# Patient Record
Sex: Female | Born: 1990
Health system: Southern US, Community
[De-identification: ages and names within clinical notes are randomized; demographics above are authoritative.]

## PROBLEM LIST (undated history)

## (undated) DIAGNOSIS — Z1330 Encounter for screening examination for mental health and behavioral disorders, unspecified: Secondary | ICD-10-CM

## (undated) DIAGNOSIS — F909 Attention-deficit hyperactivity disorder, unspecified type: Secondary | ICD-10-CM

## (undated) DIAGNOSIS — H9190 Unspecified hearing loss, unspecified ear: Secondary | ICD-10-CM

## (undated) DIAGNOSIS — T7840XA Allergy, unspecified, initial encounter: Secondary | ICD-10-CM

## (undated) DIAGNOSIS — Q899 Congenital malformation, unspecified: Secondary | ICD-10-CM

## (undated) DIAGNOSIS — E2839 Other primary ovarian failure: Secondary | ICD-10-CM

## (undated) DIAGNOSIS — R6250 Unspecified lack of expected normal physiological development in childhood: Secondary | ICD-10-CM

## (undated) DIAGNOSIS — F988 Other specified behavioral and emotional disorders with onset usually occurring in childhood and adolescence: Secondary | ICD-10-CM

## (undated) DIAGNOSIS — K5900 Constipation, unspecified: Secondary | ICD-10-CM

## (undated) DIAGNOSIS — Z1342 Encounter for screening for global developmental delays (milestones): Secondary | ICD-10-CM

## (undated) HISTORY — DX: Constipation, unspecified: K59.00

## (undated) HISTORY — DX: Unspecified lack of expected normal physiological development in childhood: R62.50

## (undated) HISTORY — DX: Unspecified hearing loss, unspecified ear: H91.90

## (undated) HISTORY — DX: Allergy, unspecified, initial encounter: T78.40XA

## (undated) HISTORY — PX: BACK SURGERY: SHX140

## (undated) HISTORY — DX: Other primary ovarian failure: E28.39

## (undated) HISTORY — DX: Encounter for screening for global developmental delays (milestones): Z13.42

## (undated) HISTORY — DX: Other specified behavioral and emotional disorders with onset usually occurring in childhood and adolescence: F98.8

## (undated) HISTORY — PX: SPINE SURGERY: SHX786

## (undated) HISTORY — DX: Encounter for screening examination for mental health and behavioral disorders, unspecified: Z13.30

## (undated) HISTORY — DX: Congenital malformation, unspecified: Q89.9

---
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
## 2009-01-20 ENCOUNTER — Ambulatory Visit (HOSPITAL_COMMUNITY): Admission: RE | Admit: 2009-01-20 | Discharge: 2009-01-20 | Payer: Self-pay | Admitting: Obstetrics & Gynecology

## 2010-04-15 ENCOUNTER — Ambulatory Visit (HOSPITAL_COMMUNITY)
Admission: AD | Admit: 2010-04-15 | Discharge: 2010-04-15 | Disposition: A | Discharge status: Home / Self Care | Payer: Medicaid Other | Source: Ambulatory Visit | Attending: Obstetrics & Gynecology | Admitting: Obstetrics & Gynecology

## 2010-04-15 ENCOUNTER — Other Ambulatory Visit: Payer: Self-pay | Admitting: Obstetrics & Gynecology

## 2010-04-15 DIAGNOSIS — F09 Unspecified mental disorder due to known physiological condition: Secondary | ICD-10-CM | POA: Insufficient documentation

## 2010-04-15 DIAGNOSIS — Z01419 Encounter for gynecological examination (general) (routine) without abnormal findings: Secondary | ICD-10-CM | POA: Insufficient documentation

## 2010-04-18 NOTE — Op Note (Signed)
  [name], [name]               ACCOUNT NO.:  [id number]  MEDICAL RECORD NO.:  [id number]           PATIENT TYPE:  O  LOCATION:  [facility]                          [facility]  PHYSICIAN:  Roseanna Rainbow, M.D.DATE OF BIRTH:  [birth year]  DATE OF PROCEDURE:  04/15/2010 DATE OF DISCHARGE:                              OPERATIVE REPORT   PREOPERATIVE DIAGNOSIS:  A patient with severe cognitive delays, unable to tolerate a pelvic exam, now for a Pap smear screening.  POSTOPERATIVE DIAGNOSIS:  A patient with severe cognitive delays, unable to tolerate a pelvic exam, now for a Pap smear screening.  PROCEDURE:  Exam under anesthesia, Pap smear.  SURGEON:  Roseanna Rainbow, MD  ANESTHESIA:  Managed anesthesia care.  SPECIMENS:  Cervical cytology to Pathology.  ESTIMATED BLOOD LOSS:  Minimal.  COMPLICATIONS:  None.  FINDINGS:  Exam under anesthesia.  On speculum exam, the vagina is clean.  The cervix is without any discharge or gross lesions.  On bimanual exam, the adnexa are nonpalpable.  A rectovaginal exam confirmed these findings.  PROCEDURE:  The patient was taken to the operating room with an IV running.  She was then placed in the semi-lithotomy position in [facility] stirrups.  After time-out had been completed, a pediatric speculum was placed in the patient's vagina with the above findings noted.  A Pap smear was taken.  This was obtained with a spatula and Cytobrush.  A bimanual exam and rectovaginal exam were then performed with the above findings noted.  At the close of the procedure, the instrument and pack counts were said to be correct x2.  The patient was taken to the PACU awake and in stable condition.     Roseanna Rainbow, M.D.     Judee Clara  D:  04/15/2010  T:  May 15, 2010  Job:  [id number]  Electronically Signed by Antionette Char M.D. on 04/17/2010 01:08:27 PM

## 2010-05-03 DEATH — deceased

## 2011-01-25 DIAGNOSIS — F819 Developmental disorder of scholastic skills, unspecified: Secondary | ICD-10-CM | POA: Insufficient documentation

## 2011-01-25 DIAGNOSIS — M40209 Unspecified kyphosis, site unspecified: Secondary | ICD-10-CM | POA: Insufficient documentation

## 2012-06-26 ENCOUNTER — Encounter: Payer: Self-pay | Admitting: Neurology

## 2012-06-26 ENCOUNTER — Ambulatory Visit (INDEPENDENT_AMBULATORY_CARE_PROVIDER_SITE_OTHER): Payer: Medicaid Other | Admitting: Neurology

## 2012-06-26 ENCOUNTER — Other Ambulatory Visit: Payer: Self-pay

## 2012-06-26 VITALS — BP 113/83 | HR 83 | Ht 64.0 in | Wt 167.0 lb

## 2012-06-26 DIAGNOSIS — R569 Unspecified convulsions: Secondary | ICD-10-CM | POA: Insufficient documentation

## 2012-06-26 DIAGNOSIS — F988 Other specified behavioral and emotional disorders with onset usually occurring in childhood and adolescence: Secondary | ICD-10-CM | POA: Insufficient documentation

## 2012-06-26 MED ORDER — LAMOTRIGINE 25 MG PO TABS: 25.0000 mg | ORAL_TABLET | Freq: Two times a day (BID) | ORAL | Status: DC

## 2012-06-26 MED ORDER — LAMOTRIGINE 25 MG PO TABS: ORAL_TABLET | ORAL | Status: DC

## 2012-06-26 MED ORDER — LAMOTRIGINE 100 MG PO TABS: 100.0000 mg | ORAL_TABLET | Freq: Two times a day (BID) | ORAL | Status: DC

## 2012-06-26 NOTE — Progress Notes (Signed)
[facility]  PATIENT: Cheryl Dominguez DOB: Jul 30, 1990  HISTORICAL Cheryl Dominguez is a 22 years old right-handed Caucasian female, referred by her primary care physician Dr. Bennetta Laos for evaluation of staring spells,  She was born with dysmorphic features, her mother had epilepsy, was taking valproic acid during pregnancy, she was developmentally delayed, delayed in motor skill, language, cognitive development, she currently lives with her grandmother, and her sister, who also suffered similar, but more severe disability, she attends day school 5 days a week, enjoys reading, and putting puzzles together, she previously had some emotional outbursts, but it seems to get better now.  Over the past 6 months, grandmother noticed that during dinner time, she had spells of staring off, unresponsive, disengaged for few second, then snap out it,  there was no body jerking movement noticed, her sister, also suffered seizure,  REVIEW OF SYSTEMS: Full 14 system review of systems performed and notable only for hearing loss, she wears hearing aid,  ALLERGIES: No Known Allergies  HOME MEDICATIONS: No outpatient prescriptions prior to visit.   No facility-administered medications prior to visit.    PAST MEDICAL HISTORY: Past Medical History  Diagnosis Date  . ADD (attention deficit disorder)   . Screening for mental disease/developmental disorder   . Hearing loss     PAST SURGICAL HISTORY: Past Surgical History  Procedure Laterality Date  . Back surgery      FAMILY HISTORY: Family History  Problem Relation Age of Onset  . Seizures Mother   . Diabetes Father     SOCIAL HISTORY:  History   Social History  . Marital Status: Single    Spouse Name: N/A    Number of Children: 0  . Years of Education: 12   Occupational History  . Not on file.   Social History Main Topics  . Smoking status: Never Smoker   . Smokeless tobacco: Never Used  . Alcohol Use: No  .  Drug Use: No  . Sexually Active: Not on file   Other Topics Concern  . Not on file   Social History Narrative   Patient lives at home with her grandmother Ferman Hamming. Patient has 12th grade education.    Right handed.   Meds ordered this encounter  Medications  . CRANBERRY EXTRACT PO    Sig: Take by mouth 2 (two) times daily.  . Calcium Acetate, Phos Binder, (CALCIUM ACETATE PO)    Sig: Take by mouth 2 (two) times daily.  . Multiple Vitamins-Minerals (MULTIVITAMIN PO)    Sig: Take by mouth 2 (two) times daily.  [name] Kitchen lisdexamfetamine (VYVANSE) 70 MG capsule    Sig: Take 70 mg by mouth every morning.  [name] Kitchen guanFACINE (INTUNIV) 1 MG TB24    Sig: Take by mouth daily.    PHYSICAL EXAM  Filed Vitals:   06/26/12 0915  BP: 113/83  Pulse: 83  Height: 5\' 4"  (1.626 m)  Weight: 167 lb (75.751 kg)    Not recorded    Body mass index is 28.65 kg/(m^2).   Generalized: In no acute distress, macrocephalic features,  Neck: Supple, no carotid bruits   Cardiac: Regular rate rhythm  Pulmonary: Clear to auscultation bilaterally  Musculoskeletal: No deformity  Neurological examination  Mentation: Alert, oriented to year, name, date of birth, age, high-pitched voice, following three-step command,  Cranial nerve II-XII: Pupils were equal round reactive to light extraocular movements were full, visual field were full on confrontational test. facial sensation and strength were normal.  She wears hearing aid bilaterally. Uvula tongue midline.  head turning and shoulder shrug and were normal and symmetric.Tongue protrusion into cheek strength was normal. Macrocephalic, high arched hard palate  Motor: normal tone, bulk and strength.  Sensory: Intact to fine touch, pinprick, preserved vibratory sensation, and proprioception at toes.  Coordination: Normal finger to nose, heel-to-shin bilaterally there was no truncal ataxia  Gait: Rising up from seated position without assistance, normal  stance, without trunk ataxia, moderate stride, good arm swing, smooth turning, able to perform tiptoe, and heel walking without difficulty.   Romberg signs: Negative  Deep tendon reflexes: Brachioradialis 2/2, biceps 2/2, triceps 2/2, patellar 2/2, Achilles 2/2, plantar responses were flexor bilaterally.    ASSESSMENT AND PLAN  22 years old right-handed Caucasian female, with past medical history of congenital abnormality, valproic acid syndrome, due to her mother has epilepsy disorder, was taking valproic acid during her pregnancy, now develop frequent spells, suggestive of partial seizure.  1. complete evaluation with MRI of brain with and without contrast, EEG. 2. lamotrigen 25mg  bid, titrating to 100mg  bid. 3. Return to clinic in 2 months     Levert Feinstein, M.D. Ph.D.  [facility] [address] [place], [place] [address] [phone]

## 2012-06-26 NOTE — Telephone Encounter (Signed)
Mom called front desk asking that we resend Rx's to [facility].

## 2012-07-03 ENCOUNTER — Ambulatory Visit (INDEPENDENT_AMBULATORY_CARE_PROVIDER_SITE_OTHER): Payer: Medicaid Other | Admitting: Radiology

## 2012-07-03 DIAGNOSIS — F988 Other specified behavioral and emotional disorders with onset usually occurring in childhood and adolescence: Secondary | ICD-10-CM

## 2012-07-03 DIAGNOSIS — R569 Unspecified convulsions: Secondary | ICD-10-CM

## 2012-07-04 NOTE — Procedures (Signed)
HISTORY: 22 years old female, with past medical history of congenital abnormality, valproic acid syndrome due to her mothers epilepsy disorder, taking valproic acid while pregnant, presenting with staring spells, suggestive of complex partial seizure  TECHNIQUE:  16 channel EEG was performed based on standard 10-16 international system. One channel was dedicated to EKG, which has demonstrates normal sinus rhythm of 78 beats per minutes.  Upon awakening, the posterior background activity was well-developed, in alpha range 10hz , with amplitude of 110 microvoltage, reactive to eye opening and closure.  There was no evidence of epilepsy for discharge.  Photic stimulation was performed, which induced a symmetric photic driving.  Hyperventilation was performed, there was no abnormality elicit.  No sleep was achieved.  CONCLUSION: This is a  normal awake EEG.  There is no electrodiagnostic evidence of epileptiform discharge

## 2012-07-08 ENCOUNTER — Ambulatory Visit
Admission: RE | Admit: 2012-07-08 | Discharge: 2012-07-08 | Disposition: A | Discharge status: Home / Self Care | Payer: Medicaid Other | Source: Ambulatory Visit | Attending: Neurology | Admitting: Neurology

## 2012-07-08 DIAGNOSIS — F988 Other specified behavioral and emotional disorders with onset usually occurring in childhood and adolescence: Secondary | ICD-10-CM

## 2012-07-10 ENCOUNTER — Telehealth: Payer: Self-pay | Admitting: Neurology

## 2012-07-10 DIAGNOSIS — R569 Unspecified convulsions: Secondary | ICD-10-CM

## 2012-07-10 DIAGNOSIS — F988 Other specified behavioral and emotional disorders with onset usually occurring in childhood and adolescence: Secondary | ICD-10-CM

## 2012-07-11 NOTE — Telephone Encounter (Signed)
Called and spoke to patient grandmother. Patient could not handle the noise of MRI. Please ask Dr.Yan what we should do. Dr.Yan advise me.

## 2012-07-11 NOTE — Telephone Encounter (Signed)
I have changed the order to CT head with/without contrast, order was placed.

## 2012-07-12 ENCOUNTER — Telehealth: Payer: Self-pay

## 2012-07-12 NOTE — Telephone Encounter (Signed)
MRI CX - CT order placed.

## 2012-07-12 NOTE — Telephone Encounter (Signed)
Called patients grandmother and spoke to her. Told her Dr.Yan is cx the MRI and Order has been placed for CT. Patients grandmother understood.

## 2012-07-19 ENCOUNTER — Ambulatory Visit: Admission: RE | Admit: 2012-07-19 | Payer: Medicaid Other | Source: Ambulatory Visit

## 2012-07-19 ENCOUNTER — Ambulatory Visit
Admission: RE | Admit: 2012-07-19 | Discharge: 2012-07-19 | Disposition: A | Discharge status: Home / Self Care | Payer: Medicaid Other | Source: Ambulatory Visit | Attending: Neurology | Admitting: Neurology

## 2012-07-19 ENCOUNTER — Other Ambulatory Visit: Payer: Self-pay | Admitting: Neurology

## 2012-07-19 DIAGNOSIS — F988 Other specified behavioral and emotional disorders with onset usually occurring in childhood and adolescence: Secondary | ICD-10-CM

## 2012-07-19 DIAGNOSIS — R569 Unspecified convulsions: Secondary | ICD-10-CM

## 2012-07-19 DIAGNOSIS — G40309 Generalized idiopathic epilepsy and epileptic syndromes, not intractable, without status epilepticus: Secondary | ICD-10-CM

## 2012-07-19 MED ORDER — IOHEXOL 300 MG/ML  SOLN
75.0000 mL | Freq: Once | INTRAMUSCULAR | Status: AC | PRN
  Administered 2012-07-19: 75 mL via INTRAVENOUS

## 2012-07-23 NOTE — Progress Notes (Signed)
Quick Note:  No answer at home #. ______

## 2012-07-24 ENCOUNTER — Telehealth: Payer: Self-pay | Admitting: Neurology

## 2012-07-24 MED ORDER — LAMOTRIGINE 100 MG PO TABS: 150.0000 mg | ORAL_TABLET | Freq: Two times a day (BID) | ORAL | Status: DC

## 2012-07-24 NOTE — Progress Notes (Signed)
Quick Note:  I LMVM for pt scan done was normal. She is to call back if questions. ______

## 2012-07-24 NOTE — Telephone Encounter (Signed)
Spoke to grandmother. Gave CT results. Will fwd to scheduling to set up 2 month f/u visit.  Grandmother says patient continues to have "staring spells", would like Dr. Terrace Arabia to know before 2 month OV. Advised would inform.

## 2012-07-24 NOTE — Telephone Encounter (Signed)
Chart reviewed, patient with partial seizure,  I have talked with her grandmother, patient goes to day program at 930am, in the afternoon, yesterday, after 5pm, she was trying to talk with patient, she was "in and out", staring off, that was the only time, grandmother truly noticed that she is having a spell.  She is still having spells almost daily, she is taking lamotrigine 100mg  bid. I will increase her to 100mg  one and 1/2 bid (150mg  bid).  Please give her a follow up appt in 2 months.

## 2012-07-30 NOTE — Telephone Encounter (Signed)
Called and spoke to Cheryl Dominguez. Patient is taking her Lamotrigine 100mg   bid. Patient is still having staring  spells late in the evening. Patient is scheduled to see Dr.Yan 08/29/2012. Doris wants Dr.Yan to be aware of this.

## 2012-08-20 ENCOUNTER — Telehealth: Payer: Self-pay | Admitting: Neurology

## 2012-08-20 NOTE — Telephone Encounter (Signed)
Patient is walking around like a zombie,has to be told over and over what to do,does not think this is the solution to the problem.  She can't let her go the beach in 2 wks in this situation. Please advise. Currently taking lamictal 150 mg

## 2012-08-20 NOTE — Telephone Encounter (Signed)
I called and spoke to grandmother.  Pt is on lamictal 150mg  po bid.  Still with same sx as initially seen for, and even as increased lamictal titration these episodes have not lessened by worsened.  Constant starry spells, disengaged , eyes dilated, is obsessive compulsive.  At her day program stares more, more emotional.  EEG normal.  Ct normal.  Taking allegra and nasacort prn only other meds.  Grandmother feels that needs to come off medication.  Please call her at this number [phone]

## 2012-08-20 NOTE — Telephone Encounter (Signed)
I left message.

## 2012-08-20 NOTE — Telephone Encounter (Signed)
I called, left message   Cheryl Dominguez, please call her grandmother again, Cheryl Dominguez is taking Lamictal 150mg  bid, ask her seizure frequency, if it is not too frequent, Lamical may be decreased to 100mg  qam/150mg  qhs.

## 2012-08-22 NOTE — Telephone Encounter (Signed)
I have called her grandmother at [phone],  She had night and day difference, yesterday was very sleepy, today, she is wide awake, she still has staring spells.  I have advised her to keep Lamictal 150mg  bid, those unreponsiveness and sleepiness could due prolonged seizure, keep follow up appt

## 2012-08-23 NOTE — Telephone Encounter (Signed)
Done

## 2012-08-26 NOTE — Telephone Encounter (Signed)
I spoke to grandmother and told her Dr. Terrace Arabia was out of office until 08-29-12 and we could not get patient in sooner unless she wanted to see NP.  She wanted to stay with Dr. Terrace Arabia.  Again says the patient has been very detatched since taking the Lamictal and also having more crying spells.  She says it has been gradually getting worse.  She will keep the appointment for 08-29-12 to see Dr. Terrace Arabia.

## 2012-08-29 ENCOUNTER — Encounter: Payer: Self-pay | Admitting: Neurology

## 2012-08-29 ENCOUNTER — Ambulatory Visit (INDEPENDENT_AMBULATORY_CARE_PROVIDER_SITE_OTHER): Payer: Medicaid Other | Admitting: Neurology

## 2012-08-29 VITALS — BP 117/75 | HR 99 | Ht 64.0 in | Wt 169.0 lb

## 2012-08-29 DIAGNOSIS — R569 Unspecified convulsions: Secondary | ICD-10-CM

## 2012-08-29 NOTE — Progress Notes (Signed)
[facility]  PATIENT: Cheryl Dominguez DOB: 01/20/1990  HISTORICAL  Cheryl Dominguez is a 22 years old right-handed Caucasian female, referred by her primary care physician Dr. Bennetta Laos for evaluation of staring spells,  She was born with dysmorphic features, her mother had epilepsy, was taking valproic acid during pregnancy, she was developmentally delayed, delayed in motor skill, language, cognitive development, she currently lives with her grandmother, and her sister, who also suffered similar, but more severe disability, she attends day school 5 days a week, enjoys reading, and putting puzzles together, she previously had some emotional outbursts, but it seems to get better now.  Over the past 6 months, starting Jan 2014, grandmother noticed that during dinner time, she had spells of staring off, unresponsive, disengaged for few second, then snap out it,  there was no body jerking movement noticed, her sister, also suffered seizure.  UPDATE August 28th, 2014:  Grandmother came in with her today, complains of worsening symptoms since lamotrigine wants to taper her off medications, there was no generalized seizure noticed,, She is not responding, getting into her face before she can answer.  When she focused on something, playing a game, doing puzzles, she appears to be normal. When she is doing something at kitchen, she is at daze, she is drugged, that she no longer has those staring spells, she used to be energetic, enthusiastic, not any more.   Before Lamictal 150mg  bid in June 2014, she was having spells 3-4 times each night.   She eats well, she sleeps well.  She was noticed to have congestions.   She is more distracted, more out of touch, not her normal self.   REVIEW OF SYSTEMS: Full 14 system review of systems performed and notable only for hearing loss, she wears hearing aid,  ALLERGIES: No Known Allergies  HOME MEDICATIONS: Outpatient Prescriptions Prior to  Visit  Medication Sig Dispense Refill  . Calcium Acetate, Phos Binder, (CALCIUM ACETATE PO) Take by mouth 2 (two) times daily.      [name] Kitchen CRANBERRY EXTRACT PO Take by mouth 2 (two) times daily.      [name] Kitchen guanFACINE (INTUNIV) 1 MG TB24 Take by mouth daily.      [name] Kitchen lamoTRIgine (LAMICTAL) 100 MG tablet Take 1.5 tablets (150 mg total) by mouth 2 (two) times daily. After titration is complete  90 tablet  12  . lamoTRIgine (LAMICTAL) 25 MG tablet One tab po bid xone week, then 2 tabs po bid x one week, then 3 tabs po bid xone week, then 4 tabs po bid  100 tablet  0  . lisdexamfetamine (VYVANSE) 70 MG capsule Take 70 mg by mouth every morning.      . Multiple Vitamins-Minerals (MULTIVITAMIN PO) Take by mouth 2 (two) times daily.       No facility-administered medications prior to visit.    PAST MEDICAL HISTORY: Past Medical History  Diagnosis Date  . ADD (attention deficit disorder)   . Screening for mental disease/developmental disorder   . Hearing loss     PAST SURGICAL HISTORY: Past Surgical History  Procedure Laterality Date  . Back surgery      FAMILY HISTORY: Family History  Problem Relation Age of Onset  . Seizures Mother   . Diabetes Father     SOCIAL HISTORY:  History   Social History  . Marital Status: Single    Spouse Name: N/A    Number of Children: 0  . Years of Education: 25  Occupational History  . Not on file.   Social History Main Topics  . Smoking status: Never Smoker   . Smokeless tobacco: Never Used  . Alcohol Use: No  . Drug Use: No  . Sexual Activity: Not on file   Other Topics Concern  . Not on file   Social History Narrative   Patient lives at home with her grandmother Cheryl Dominguez. Patient has 12th grade education.    Right handed.   Meds ordered this encounter  Medications  . CRANBERRY EXTRACT PO    Sig: Take by mouth 2 (two) times daily.  . Calcium Acetate, Phos Binder, (CALCIUM ACETATE PO)    Sig: Take by mouth 2 (two) times daily.    . Multiple Vitamins-Minerals (MULTIVITAMIN PO)    Sig: Take by mouth 2 (two) times daily.  [name] Kitchen lisdexamfetamine (VYVANSE) 70 MG capsule    Sig: Take 70 mg by mouth every morning.  [name] Kitchen guanFACINE (INTUNIV) 1 MG TB24    Sig: Take by mouth daily.    PHYSICAL EXAM  Generalized: In no acute distress, macrocephalic features,  Neck: Supple, no carotid bruits   Cardiac: Regular rate rhythm  Pulmonary: Clear to auscultation bilaterally  Musculoskeletal: No deformity  Neurological examination  Mentation: Alert, oriented to year, name, date of birth, age, high-pitched voice, following three-step command,   Cranial nerve II-XII: Pupils were equal round reactive to light extraocular movements were full, visual field were full on confrontational test. facial sensation and strength were normal. She wears hearing aid bilaterally. Uvula tongue midline.  head turning and shoulder shrug and were normal and symmetric.Tongue protrusion into cheek strength was normal. Macrocephalic, high arched hard palate  Motor: normal tone, bulk and strength.  Sensory: Intact to fine touch, pinprick, preserved vibratory sensation, and proprioception at toes.  Coordination: Normal finger to nose, heel-to-shin bilaterally there was no truncal ataxia  Gait: Rising up from seated position without assistance, normal stance, without trunk ataxia, moderate stride, good arm swing, smooth turning, able to perform tiptoe, and heel walking without difficulty.   Romberg signs: Negative  Deep tendon reflexes: Brachioradialis 2/2, biceps 2/2, triceps 2/2, patellar 2/2, Achilles 2/2, plantar responses were flexor bilaterally.    ASSESSMENT AND PLAN  22 years old right-handed Caucasian female, with past medical history of congenital abnormality, valproic acid syndrome, due to her mother has epilepsy disorder, was taking valproic acid during her pregnancy, now develop frequent spells, suggestive of partial seizure, grandmother  complains of significant side effect with lamotrigine.  1. I will gradually taper her off lamotrigen 2. Return to clinic in 6 months,call office for new issues

## 2012-08-29 NOTE — Patient Instructions (Signed)
She is taking Lamotrigin 100mg , one and 1/2 tab twice a day.  She should take Lamotrigin 100mg  one tab twice a day x2 weeks, then one tabs every night x 2weeks, then 1/2 tab every night x 2 week, then stop

## 2012-12-30 ENCOUNTER — Ambulatory Visit: Payer: Medicaid Other | Admitting: Nurse Practitioner

## 2013-01-27 ENCOUNTER — Telehealth: Payer: Self-pay | Admitting: Nurse Practitioner

## 2013-01-27 ENCOUNTER — Ambulatory Visit: Payer: Medicaid Other | Admitting: Nurse Practitioner

## 2013-01-27 NOTE — Telephone Encounter (Signed)
No show for scheduled appt 

## 2013-02-11 ENCOUNTER — Telehealth: Payer: Self-pay | Admitting: Neurology

## 2013-02-11 NOTE — Telephone Encounter (Signed)
Cheryl Dominguez filling in for patient's grandmother while her health is recovering. Cheryl Dominguez states she is standing in as power of attorney, can be reached at [phone]

## 2013-04-16 ENCOUNTER — Telehealth: Payer: Self-pay | Admitting: *Deleted

## 2013-04-16 NOTE — Telephone Encounter (Signed)
Copy of power of attorney in medical records dept.

## 2013-05-07 ENCOUNTER — Encounter: Payer: Self-pay | Admitting: Neurology

## 2013-05-09 NOTE — Telephone Encounter (Signed)
Have appt in May 12th 2015

## 2013-05-13 ENCOUNTER — Ambulatory Visit (INDEPENDENT_AMBULATORY_CARE_PROVIDER_SITE_OTHER): Payer: Medicaid Other | Admitting: Neurology

## 2013-05-13 ENCOUNTER — Encounter: Payer: Self-pay | Admitting: Neurology

## 2013-05-13 VITALS — BP 98/63 | HR 83 | Ht 64.0 in | Wt 170.0 lb

## 2013-05-13 DIAGNOSIS — R569 Unspecified convulsions: Secondary | ICD-10-CM

## 2013-05-13 DIAGNOSIS — F988 Other specified behavioral and emotional disorders with onset usually occurring in childhood and adolescence: Secondary | ICD-10-CM

## 2013-05-13 NOTE — Progress Notes (Signed)
[facility]  PATIENT: Cheryl Dominguez DOB: July 14, 1990  HISTORICAL  Cheryl Dominguez is a 23 years old right-handed Caucasian female, referred by her primary care physician Dr. Francoise Ceo for evaluation of staring spells,  She was born with dysmorphic features, her mother had epilepsy, was taking valproic acid during pregnancy, she was developmentally delayed, delayed in motor skill, language, cognitive development, she currently lives with her grandmother, and her sister, who also suffered similar, but more severe disability, she attends day school 5 days a week, enjoys reading, and putting puzzles together, she previously had some emotional outbursts, but it seems to get better now.  Over the past 6 months, starting Jan 2014, grandmother noticed that during dinner time, she had spells of staring off, unresponsive, dis-engaged for few second, then snap out it,  there was no body jerking movement noticed, her sister, also suffered seizure.  UPDATE August 28th, 2014:  Grandmother came in with her today, complains of worsening symptoms since lamotrigine was started, wants to taper her off medications, there was no generalized seizure noticed.  She is not responding during the episodes, getting into her face before she can answer.  When she focused on something, playing a game, doing puzzles, she appears to be normal. When she is doing something at kitchen, she is at [place], she is drugged, but she no longer has those staring spells, she used to be energetic, enthusiastic, not any more.   Before Lamictal 150mg  bid in June 2014, she was having spells 3-4 times each night.   She eats well, she sleeps well.  She was noticed to have congestions.   She is more distracted, more out of touch, not her normal self.   UPDATE May 12th 2015: Her family friend Cheryl Dominguez takes her in, her grandmother is ill,, she started laughing spells since fall of 2014, involuntary, I watched the videotape, she  was responsive during the laughing spell, she was able to continue sort through her puzzles.   She is eating well, sleeping well, no behavior issues, her grandmother fell, suffered fracture,   REVIEW OF SYSTEMS: Full 14 system review of systems performed and notable only for  As above.  ALLERGIES: No Known Allergies  HOME MEDICATIONS: Outpatient Prescriptions Prior to Visit  Medication Sig Dispense Refill  . Calcium Acetate, Phos Binder, (CALCIUM ACETATE PO) Take by mouth 2 (two) times daily.      [name] Kitchen CRANBERRY EXTRACT PO Take by mouth 2 (two) times daily.      [name] Kitchen guanFACINE (INTUNIV) 1 MG TB24 Take by mouth daily.      [name] Kitchen lisdexamfetamine (VYVANSE) 70 MG capsule Take 50 mg by mouth every morning.       . Multiple Vitamins-Minerals (MULTIVITAMIN PO) Take by mouth 2 (two) times daily.      [name] Kitchen lamoTRIgine (LAMICTAL) 100 MG tablet Take 1.5 tablets (150 mg total) by mouth 2 (two) times daily. After titration is complete  90 tablet  12   No facility-administered medications prior to visit.    PAST MEDICAL HISTORY: Past Medical History  Diagnosis Date  . ADD (attention deficit disorder)   . Screening for mental disease/developmental disorder   . Hearing loss     PAST SURGICAL HISTORY: Past Surgical History  Procedure Laterality Date  . Back surgery      FAMILY HISTORY: Family History  Problem Relation Age of Onset  . Seizures Mother   . Diabetes Father     SOCIAL HISTORY:  History  Social History  . Marital Status: Single    Spouse Name: N/A    Number of Children: 0  . Years of Education: 12   Occupational History  .      works at [employer]   Social History Main Topics  . Smoking status: Never Smoker   . Smokeless tobacco: Never Used  . Alcohol Use: No  . Drug Use: No  . Sexual Activity: Not on file   Other Topics Concern  . Not on file   Social History Narrative   Patient lives at home with her grandmother Cheryl Dominguez. Patient has 12th grade  education.    Right handed.   Caffeine- Couple times a week.- One   Meds ordered this encounter  Medications  . CRANBERRY EXTRACT PO    Sig: Take by mouth 2 (two) times daily.  . Calcium Acetate, Phos Binder, (CALCIUM ACETATE PO)    Sig: Take by mouth 2 (two) times daily.  . Multiple Vitamins-Minerals (MULTIVITAMIN PO)    Sig: Take by mouth 2 (two) times daily.  [name] Kitchen lisdexamfetamine (VYVANSE) 70 MG capsule    Sig: Take 70 mg by mouth every morning.  [name] Kitchen guanFACINE (INTUNIV) 1 MG TB24    Sig: Take by mouth daily.    PHYSICAL EXAM  Generalized: In no acute distress, macrocephalic features,  Neck: Supple, no carotid bruits   Cardiac: Regular rate rhythm  Pulmonary: Clear to auscultation bilaterally  Musculoskeletal: No deformity  Neurological examination  Mentation:  dysmorphic features, Alert, oriented to year, name, date of birth, age, high-pitched voice, following three-step command,   Cranial nerve II-XII: Pupils were equal round reactive to light extraocular movements were full, visual field were full on confrontational test. facial sensation and strength were normal. She wears hearing aid bilaterally. Uvula tongue midline.  head turning and shoulder shrug and were normal and symmetric.Tongue protrusion into cheek strength was normal. Macrocephalic, high arched hard palate  Motor: normal tone, bulk and strength.  Sensory: Intact to fine touch, pinprick, preserved vibratory sensation, and proprioception at toes.  Coordination: Normal finger to nose, heel-to-shin bilaterally there was no truncal ataxia  Gait: Rising up from seated position without assistance, normal stance, without trunk ataxia, moderate stride, good arm swing, smooth turning, able to perform tiptoe, and heel walking without difficulty.   Romberg signs: Negative  Deep tendon reflexes: Brachioradialis 2/2, biceps 2/2, triceps 2/2, patellar 2/2, Achilles 2/2, plantar responses were flexor bilaterally.     ASSESSMENT AND PLAN   23  years old right-handed Caucasian female, with past medical history of congenital abnormality, valproic acid syndrome, due to her mother has epilepsy disorder, was taking valproic acid during her pregnancy, CAT scan of the brain was normal, EEG was normal, there was spells concerning the family, but made worse by a Lamictal trial, I watched the videotape of her involuntary laughing spells, she was responsive during the episode, does not looks like a partial seizure she is otherwise doing well at her baseline, only return to clinic for new issues,

## 2013-06-12 ENCOUNTER — Ambulatory Visit: Payer: Medicaid Other | Admitting: Nurse Practitioner

## 2013-12-10 ENCOUNTER — Encounter (HOSPITAL_COMMUNITY): Payer: Self-pay | Admitting: Emergency Medicine

## 2013-12-10 ENCOUNTER — Ambulatory Visit: Payer: Medicaid Other

## 2013-12-10 ENCOUNTER — Other Ambulatory Visit (HOSPITAL_COMMUNITY)
Admission: RE | Admit: 2013-12-10 | Discharge: 2013-12-10 | Disposition: A | Discharge status: Home / Self Care | Payer: Medicaid Other | Source: Ambulatory Visit | Attending: Emergency Medicine | Admitting: Emergency Medicine

## 2013-12-10 ENCOUNTER — Emergency Department (HOSPITAL_COMMUNITY)
Admission: EM | Admit: 2013-12-10 | Discharge: 2013-12-10 | Disposition: A | Discharge status: Home / Self Care | Payer: Medicaid Other | Source: Home / Self Care | Attending: Emergency Medicine | Admitting: Emergency Medicine

## 2013-12-10 DIAGNOSIS — Z113 Encounter for screening for infections with a predominantly sexual mode of transmission: Secondary | ICD-10-CM | POA: Insufficient documentation

## 2013-12-10 DIAGNOSIS — N760 Acute vaginitis: Secondary | ICD-10-CM | POA: Insufficient documentation

## 2013-12-10 DIAGNOSIS — B9689 Other specified bacterial agents as the cause of diseases classified elsewhere: Secondary | ICD-10-CM

## 2013-12-10 DIAGNOSIS — A499 Bacterial infection, unspecified: Secondary | ICD-10-CM

## 2013-12-10 MED ORDER — FLUCONAZOLE 150 MG PO TABS: 150.0000 mg | ORAL_TABLET | Freq: Once | ORAL | Status: DC

## 2013-12-10 MED ORDER — METRONIDAZOLE 500 MG PO TABS: 500.0000 mg | ORAL_TABLET | Freq: Two times a day (BID) | ORAL | Status: DC

## 2013-12-10 NOTE — Discharge Instructions (Signed)
Bacterial Vaginosis Bacterial vaginosis is a vaginal infection that occurs when the normal balance of bacteria in the vagina is disrupted. It results from an overgrowth of certain bacteria. This is the most common vaginal infection in women of childbearing age. Treatment is important to prevent complications, especially in pregnant women, as it can cause a premature delivery. CAUSES  Bacterial vaginosis is caused by an increase in harmful bacteria that are normally present in smaller amounts in the vagina. Several different kinds of bacteria can cause bacterial vaginosis. However, the reason that the condition develops is not fully understood. RISK FACTORS Certain activities or behaviors can put you at an increased risk of developing bacterial vaginosis, including:  Having a new sex partner or multiple sex partners.  Douching.  Using an intrauterine device (IUD) for contraception. Women do not get bacterial vaginosis from toilet seats, bedding, swimming pools, or contact with objects around them. SIGNS AND SYMPTOMS  Some women with bacterial vaginosis have no signs or symptoms. Common symptoms include:  Grey vaginal discharge.  A fishlike odor with discharge, especially after sexual intercourse.  Itching or burning of the vagina and vulva.  Burning or pain with urination. DIAGNOSIS  Your health care provider will take a medical history and examine the vagina for signs of bacterial vaginosis. A sample of vaginal fluid may be taken. Your health care provider will look at this sample under a microscope to check for bacteria and abnormal cells. A vaginal pH test may also be done.  TREATMENT  Bacterial vaginosis may be treated with antibiotic medicines. These may be given in the form of a pill or a vaginal cream. A second round of antibiotics may be prescribed if the condition comes back after treatment.  HOME CARE INSTRUCTIONS   Only take over-the-counter or prescription medicines as  directed by your health care provider.  If antibiotic medicine was prescribed, take it as directed. Make sure you finish it even if you start to feel better.  Do not have sex until treatment is completed.  Tell all sexual partners that you have a vaginal infection. They should see their health care provider and be treated if they have problems, such as a mild rash or itching.  Practice safe sex by using condoms and only having one sex partner. SEEK MEDICAL CARE IF:   Your symptoms are not improving after 3 days of treatment.  You have increased discharge or pain.  You have a fever. MAKE SURE YOU:   Understand these instructions.  Will watch your condition.  Will get help right away if you are not doing well or get worse. FOR MORE INFORMATION  Centers for Disease Control and Prevention, Division of STD Prevention: AppraiserFraud.fi American Sexual Health Association (ASHA): www.ashastd.org  Document Released: 12/19/2004 Document Revised: 10/09/2012 Document Reviewed: 07/31/2012 [facility] Patient Information 2015 [place], [place]. This information is not intended to replace advice given to you by your health care provider. Make sure you discuss any questions you have with your health care provider.  To restore the normal balance of "good bacteria" in your system.  Take a probiotic once daily.  These can be gotten over the counter at the drug store without a prescription and come under various brand names such as [place], [place], Flor[employer], and [employer].  The best thing to do is to ask your pharmacist to recommend a good probiotic that is not too expensive.

## 2013-12-10 NOTE — ED Provider Notes (Signed)
  Chief Complaint   Vaginal Discharge   History of Present Illness   Cheryl Dominguez is a 23 year old developmentally delayed female who presents with a one-day history of vaginal odor, discharge, and slight itching. She has not had menstrual periods. She denies any pain, fever, nausea, or vomiting. She is here today with her group home person.  Review of Systems   Other than as noted above, the patient denies any of the following symptoms: Systemic:  No fever or chills GI:  No abdominal pain, nausea, vomiting, diarrhea, constipation, melena or hematochezia. GU:  No dysuria, frequency, urgency, hematuria, vaginal discharge, itching, or abnormal vaginal bleeding.  [place]   Past medical history, family history, social history, meds, and allergies were reviewed.  She has ADHD and takes Vyvanse, Intuniv, and loratadine for allergies  Physical Examination    Vital signs:  BP 114/69 mmHg  Pulse 82  Temp(Src) 98.8 F (37.1 C) (Oral)  Resp 16  SpO2 98% General:  Alert, oriented and in no distress. Pelvic exam:  She would not allow a pelvic exam to be done.  DNA probes for gonorrhea, Chlamydia, Trichomonas, Gardnerella, Candida were obtained on her urine. Skin:  Clear, warm and dry.  Assessment   The encounter diagnosis was Bacterial vaginosis.       Plan    1.  Meds:  The following meds were prescribed:   Discharge Medication List as of 12/10/2013  8:38 PM    START taking these medications   Details  fluconazole (DIFLUCAN) 150 MG tablet Take 1 tablet (150 mg total) by mouth once., Starting 12/10/2013, Print    metroNIDAZOLE (FLAGYL) 500 MG tablet Take 1 tablet (500 mg total) by mouth 2 (two) times daily., Starting 12/10/2013, Until Discontinued, Print        2.  Patient Education/Counseling:  The group home worker was given appropriate handouts, self care instructions, and instructed in symptomatic relief.    3.  Follow up:  The group home worker was told to follow up here if  no better in 3 to 4 days, or sooner if becoming worse in any way, and given some red flag symptoms such as worsening pain, fever, persistent vomiting, or heavy vaginal bleeding which would prompt immediate return.       Harden Mo, MD 12/10/13 [phone]

## 2013-12-10 NOTE — ED Notes (Signed)
C/o vag d/c onset today w/foul odor Sx also include some itching Hx of developmental disability and ADHD Alert, no signs of acute distress.

## 2013-12-11 LAB — URINE CYTOLOGY ANCILLARY ONLY
CHLAMYDIA, DNA PROBE: NEGATIVE
NEISSERIA GONORRHEA: NEGATIVE
TRICH (WINDOWPATH): NEGATIVE

## 2013-12-13 NOTE — ED Notes (Signed)
GC/chlamydia, trichomonas negative, no further action required

## 2013-12-20 ENCOUNTER — Telehealth (HOSPITAL_COMMUNITY): Payer: Self-pay | Admitting: Emergency Medicine

## 2013-12-20 MED ORDER — FLUCONAZOLE 200 MG PO TABS: 200.0000 mg | ORAL_TABLET | Freq: Every day | ORAL | Status: AC

## 2013-12-20 NOTE — ED Notes (Signed)
Her caregiver and her grandmother who is her power of attorney called today and said that they thought that [place] had oral thrush. They noticed white spots inside her mouth. She was not complaining of anything. Will treat with Diflucan 200 mg once daily for 7 days.  Harden Mo, MD 12/20/13 [phone]

## 2013-12-31 DIAGNOSIS — K143 Hypertrophy of tongue papillae: Secondary | ICD-10-CM | POA: Insufficient documentation

## 2014-02-19 ENCOUNTER — Encounter (HOSPITAL_COMMUNITY): Payer: Self-pay | Admitting: *Deleted

## 2014-02-19 ENCOUNTER — Inpatient Hospital Stay (HOSPITAL_COMMUNITY)
Admission: AD | Admit: 2014-02-19 | Discharge: 2014-02-19 | Disposition: A | Discharge status: Home / Self Care | Payer: Medicaid Other | Source: Ambulatory Visit | Attending: Obstetrics & Gynecology | Admitting: Obstetrics & Gynecology

## 2014-02-19 DIAGNOSIS — N898 Other specified noninflammatory disorders of vagina: Secondary | ICD-10-CM

## 2014-02-19 DIAGNOSIS — L292 Pruritus vulvae: Secondary | ICD-10-CM | POA: Diagnosis present

## 2014-02-19 DIAGNOSIS — L298 Other pruritus: Secondary | ICD-10-CM

## 2014-02-19 HISTORY — DX: Other specified behavioral and emotional disorders with onset usually occurring in childhood and adolescence: F98.8

## 2014-02-19 LAB — URINE MICROSCOPIC-ADD ON

## 2014-02-19 LAB — URINALYSIS, ROUTINE W REFLEX MICROSCOPIC
BILIRUBIN URINE: NEGATIVE
Glucose, UA: NEGATIVE mg/dL
HGB URINE DIPSTICK: NEGATIVE
KETONES UR: NEGATIVE mg/dL
NITRITE: NEGATIVE
Protein, ur: NEGATIVE mg/dL
Specific Gravity, Urine: >1.03 — ABNORMAL HIGH (ref 1.005–1.030)
UROBILINOGEN UA: 0.2 mg/dL (ref 0.0–1.0)
pH: 5.5 (ref 5.0–8.0)

## 2014-02-19 LAB — WET PREP, GENITAL
CLUE CELLS WET PREP: NONE SEEN
Trich, Wet Prep: NONE SEEN
YEAST WET PREP: NONE SEEN

## 2014-02-19 LAB — POCT PREGNANCY, URINE: Preg Test, Ur: NEGATIVE

## 2014-02-19 MED ORDER — CLOTRIMAZOLE-BETAMETHASONE 1-0.05 % EX LOTN: TOPICAL_LOTION | Freq: Two times a day (BID) | CUTANEOUS | Status: DC

## 2014-02-19 NOTE — MAU Note (Signed)
Previously treated for vaginosis and yeast (Diflucan, Flagyl)

## 2014-02-19 NOTE — MAU Provider Note (Signed)
History     CSN: [id number]  Arrival date and time: 02/19/14 2703   First Provider Initiated Contact with Patient 02/19/14 [phone]      Chief Complaint  Patient presents with  . Vaginal Discharge   HPI This is a 24 y.o. female who is brought by her caregiver with c/o vaginal itching. Has been treated for BV and oral thrush in past. States has never fully developed and has never had a period. Has a syndrome associated with her mothers use of Valproic Acid during pregnancy.  States was treated for BV in fall and then got oral thrush, so was treated with Diflucan for a week.   RN Note: Ongoing problem since Dec. Was treated, but doesn't seem to be under control. Lots of itching.          Expand All Collapse All   Pt is special needs with caretaker today. Extremely anxious and scared about situation. Pt expressed fear of Female provider. Clementeen Graham, CNA reassured her that a female provider would be seeing her today. Hansel Feinstein, CNM was given report and info prior to seeing pt.         OB History    Gravida Para Term Preterm AB TAB SAB Ectopic Multiple Living   0 0 0 0 0 0 0 0 0 0       Past Medical History  Diagnosis Date  . ADD (attention deficit disorder)   . Screening for mental disease/developmental disorder   . Hearing loss   . Attention deficit disorder     Past Surgical History  Procedure Laterality Date  . Back surgery      Family History  Problem Relation Age of Onset  . Seizures Mother   . Diabetes Father     History  Substance Use Topics  . Smoking status: Never Smoker   . Smokeless tobacco: Never Used  . Alcohol Use: No    Allergies: No Known Allergies  Prescriptions prior to admission  Medication Sig Dispense Refill Last Dose  . Calcium Acetate, Phos Binder, (CALCIUM ACETATE PO) Take by mouth 2 (two) times daily.   02/19/2014 at Unknown time  . CRANBERRY EXTRACT PO Take by mouth 2 (two) times daily.   02/18/2014 at Unknown time  .  guanFACINE (INTUNIV) 1 MG TB24 Take by mouth daily.   02/19/2014 at Unknown time  . lisdexamfetamine (VYVANSE) 70 MG capsule Take 50 mg by mouth every morning.    02/19/2014 at Unknown time  . loratadine (CLARITIN) 10 MG tablet Take 10 mg by mouth daily.   02/19/2014 at Unknown time  . LORazepam (ATIVAN) 1 MG tablet Take 1 mg by mouth daily as needed for anxiety.   02/19/2014 at Unknown time  . Multiple Vitamins-Minerals (MULTIVITAMIN PO) Take by mouth 2 (two) times daily.   02/19/2014 at Unknown time  . risperiDONE (RISPERDAL) 0.25 MG tablet Take 0.25 mg by mouth 2 (two) times daily.   02/19/2014 at Unknown time  . fluconazole (DIFLUCAN) 150 MG tablet Take 1 tablet (150 mg total) by mouth once. (Patient not taking: Reported on 02/19/2014) 1 tablet 0 Completed Course at Unknown time  . metroNIDAZOLE (FLAGYL) 500 MG tablet Take 1 tablet (500 mg total) by mouth 2 (two) times daily. (Patient not taking: Reported on 02/19/2014) 14 tablet 0 Completed Course at Unknown time    Review of Systems  Constitutional: Negative for fever, chills and malaise/fatigue.  Gastrointestinal: Negative for nausea, vomiting, abdominal pain, diarrhea and constipation.  Genitourinary: Negative  for dysuria.       Vaginal itching  Skin: Positive for itching.   Physical Exam   Blood pressure 123/67, pulse 101, temperature 98.2 F (36.8 C), temperature source Oral, resp. rate 18.  Physical Exam  Constitutional: She is oriented to person, place, and time. She appears well-developed and well-nourished. No distress.  HENT:  Head: Normocephalic.  Cardiovascular: Normal rate.   Respiratory: Effort normal.  Genitourinary: Vagina normal. No vaginal discharge found.  Wet prep done  Musculoskeletal: Normal range of motion.  Neurological: She is alert and oriented to person, place, and time.  Skin: Skin is warm and dry.  Psychiatric: She has a normal mood and affect.    MAU Course  Procedures  MDM Wet prep done Results for  orders placed or performed during the hospital encounter of 02/19/14 (from the past 24 hour(s))  Urinalysis, Routine w reflex microscopic     Status: Abnormal   Collection Time: 02/19/14  8:20 AM  Result Value Ref Range   Color, Urine YELLOW YELLOW   APPearance CLEAR CLEAR   Specific Gravity, Urine >1.030 (H) 1.005 - 1.030   pH 5.5 5.0 - 8.0   Glucose, UA NEGATIVE NEGATIVE mg/dL   Hgb urine dipstick NEGATIVE NEGATIVE   Bilirubin Urine NEGATIVE NEGATIVE   Ketones, ur NEGATIVE NEGATIVE mg/dL   Protein, ur NEGATIVE NEGATIVE mg/dL   Urobilinogen, UA 0.2 0.0 - 1.0 mg/dL   Nitrite NEGATIVE NEGATIVE   Leukocytes, UA MODERATE (A) NEGATIVE  Urine microscopic-add on     Status: None   Collection Time: 02/19/14  8:20 AM  Result Value Ref Range   Squamous Epithelial / LPF RARE RARE   WBC, UA 3-6 <3 WBC/hpf  Pregnancy, urine POC     Status: None   Collection Time: 02/19/14  8:36 AM  Result Value Ref Range   Preg Test, Ur NEGATIVE NEGATIVE  Wet prep, genital     Status: Abnormal   Collection Time: 02/19/14  9:10 AM  Result Value Ref Range   Yeast Wet Prep HPF POC NONE SEEN NONE SEEN   Trich, Wet Prep NONE SEEN NONE SEEN   Clue Cells Wet Prep HPF POC NONE SEEN NONE SEEN   WBC, Wet Prep HPF POC FEW (A) NONE SEEN    Assessment and Plan  A:  Vaginal itching  P:  Discharge home       No evidence of yeast but will give cream for external vulva only for symptoms (Lotrisone)        No antibiotics at this time         Urine to culture   [facility] 02/19/2014, 9:29 AM

## 2014-02-19 NOTE — MAU Note (Signed)
Ongoing problem since Dec. Was treated, but doesn't seem to be under control.  Lots of itching.

## 2014-02-19 NOTE — MAU Note (Signed)
Pt is special needs with caretaker today.  Extremely anxious and scared about situation.  Pt expressed fear of Female provider.  Clementeen Graham, CNA reassured her that a female provider would be seeing her today.  Hansel Feinstein, CNM was given report and info prior to seeing pt.

## 2014-02-19 NOTE — Discharge Instructions (Signed)

## 2014-02-21 LAB — URINE CULTURE
Colony Count: >=100000
Special Requests: NORMAL

## 2014-02-22 ENCOUNTER — Other Ambulatory Visit (HOSPITAL_COMMUNITY): Payer: Self-pay | Admitting: Advanced Practice Midwife

## 2014-02-22 DIAGNOSIS — N3000 Acute cystitis without hematuria: Secondary | ICD-10-CM

## 2014-02-22 DIAGNOSIS — N390 Urinary tract infection, site not specified: Secondary | ICD-10-CM | POA: Insufficient documentation

## 2014-02-22 MED ORDER — SULFAMETHOXAZOLE-TRIMETHOPRIM 800-160 MG PO TABS: 1.0000 | ORAL_TABLET | Freq: Two times a day (BID) | ORAL | Status: DC

## 2014-02-22 NOTE — Progress Notes (Signed)
Seen on 02/19/14 for vaginal itching and dysuria  Urine culture resulted with Morganella and diptheroids  Rx for septra DS sent to pharmacy.

## 2014-02-23 ENCOUNTER — Telehealth: Payer: Self-pay | Admitting: Obstetrics and Gynecology

## 2014-02-23 NOTE — Telephone Encounter (Signed)
RX sent. Magda Paganini picked up Bactrim from pharmacy for UTI> + urine culture.

## 2014-03-21 ENCOUNTER — Encounter (HOSPITAL_COMMUNITY): Payer: Self-pay | Admitting: General Practice

## 2014-03-21 ENCOUNTER — Inpatient Hospital Stay (HOSPITAL_COMMUNITY)
Admission: AD | Admit: 2014-03-21 | Discharge: 2014-03-21 | Disposition: A | Discharge status: Home / Self Care | Payer: Medicaid Other | Source: Ambulatory Visit | Attending: Obstetrics & Gynecology | Admitting: Obstetrics & Gynecology

## 2014-03-21 DIAGNOSIS — N390 Urinary tract infection, site not specified: Secondary | ICD-10-CM | POA: Diagnosis not present

## 2014-03-21 DIAGNOSIS — B3731 Acute candidiasis of vulva and vagina: Secondary | ICD-10-CM

## 2014-03-21 DIAGNOSIS — B373 Candidiasis of vulva and vagina: Secondary | ICD-10-CM | POA: Insufficient documentation

## 2014-03-21 DIAGNOSIS — L293 Anogenital pruritus, unspecified: Secondary | ICD-10-CM | POA: Diagnosis present

## 2014-03-21 HISTORY — DX: Attention-deficit hyperactivity disorder, unspecified type: F90.9

## 2014-03-21 LAB — URINALYSIS, ROUTINE W REFLEX MICROSCOPIC
BILIRUBIN URINE: NEGATIVE
GLUCOSE, UA: NEGATIVE mg/dL
Hgb urine dipstick: NEGATIVE
KETONES UR: NEGATIVE mg/dL
NITRITE: NEGATIVE
Protein, ur: NEGATIVE mg/dL
Specific Gravity, Urine: 1.01 (ref 1.005–1.030)
UROBILINOGEN UA: 0.2 mg/dL (ref 0.0–1.0)
pH: 6 (ref 5.0–8.0)

## 2014-03-21 LAB — URINE MICROSCOPIC-ADD ON

## 2014-03-21 LAB — WET PREP, GENITAL
TRICH WET PREP: NONE SEEN
WBC, Wet Prep HPF POC: NONE SEEN
Yeast Wet Prep HPF POC: NONE SEEN

## 2014-03-21 LAB — POCT PREGNANCY, URINE: Preg Test, Ur: NEGATIVE

## 2014-03-21 MED ORDER — FLUCONAZOLE 150 MG PO TABS: 150.0000 mg | ORAL_TABLET | Freq: Every day | ORAL | Status: DC

## 2014-03-21 MED ORDER — SULFAMETHOXAZOLE-TRIMETHOPRIM 800-160 MG PO TABS: 1.0000 | ORAL_TABLET | Freq: Two times a day (BID) | ORAL | Status: AC

## 2014-03-21 NOTE — MAU Provider Note (Signed)
History     CSN: [id number]  Arrival date and time: 03/21/14 2992   First Provider Initiated Contact with Patient 03/21/14 1016      Chief Complaint  Patient presents with  . Vaginal Itching   HPI  Ms. CAROLEANN CASLER is a 24 y.o. female G0P0000 who presents with vaginal itching and odorous urine.  The symptoms started last night.   She is here with her care giver; the patient has a history of mental/ developmental disorder.    OB History    Gravida Para Term Preterm AB TAB SAB Ectopic Multiple Living   0 0 0 0 0 0 0 0 0 0       Past Medical History  Diagnosis Date  . ADD (attention deficit disorder)   . Screening for mental disease/developmental disorder   . Hearing loss   . Attention deficit disorder   . ADHD (attention deficit hyperactivity disorder)     Past Surgical History  Procedure Laterality Date  . Back surgery      Family History  Problem Relation Age of Onset  . Seizures Mother   . Diabetes Father     History  Substance Use Topics  . Smoking status: Never Smoker   . Smokeless tobacco: Never Used  . Alcohol Use: No    Allergies: No Known Allergies  Prescriptions prior to admission  Medication Sig Dispense Refill Last Dose  . CALCIUM PO Take 1 capsule by mouth 2 (two) times daily.   03/21/2014 at Unknown time  . CRANBERRY EXTRACT PO Take by mouth 2 (two) times daily.   03/20/2014 at Unknown time  . guanFACINE (INTUNIV) 1 MG TB24 Take by mouth daily.   03/21/2014 at Unknown time  . lisdexamfetamine (VYVANSE) 70 MG capsule Take 50 mg by mouth every morning.    03/21/2014 at Unknown time  . loratadine (CLARITIN) 10 MG tablet Take 10 mg by mouth daily.   03/21/2014 at Unknown time  . LORazepam (ATIVAN) 1 MG tablet Take 1 mg by mouth daily as needed for anxiety.   03/21/2014 at Unknown time  . Multiple Vitamins-Minerals (MULTIVITAMIN PO) Take by mouth 2 (two) times daily.   03/21/2014 at Unknown time  . risperiDONE (RISPERDAL) 0.5 MG tablet Take 0.5 mg by  mouth 2 (two) times daily.   03/21/2014 at Unknown time  . clotrimazole-betamethasone (LOTRISONE) lotion Apply topically 2 (two) times daily. (Patient not taking: Reported on 03/21/2014) 30 mL 0   . sulfamethoxazole-trimethoprim (BACTRIM DS,SEPTRA DS) 800-160 MG per tablet Take 1 tablet by mouth 2 (two) times daily. (Patient not taking: Reported on 03/21/2014) 14 tablet 1    Results for orders placed or performed during the hospital encounter of 03/21/14 (from the past 48 hour(s))  Urinalysis, Routine w reflex microscopic     Status: Abnormal   Collection Time: 03/21/14  9:30 AM  Result Value Ref Range   Color, Urine YELLOW YELLOW   APPearance CLEAR CLEAR   Specific Gravity, Urine 1.010 1.005 - 1.030   pH 6.0 5.0 - 8.0   Glucose, UA NEGATIVE NEGATIVE mg/dL   Hgb urine dipstick NEGATIVE NEGATIVE   Bilirubin Urine NEGATIVE NEGATIVE   Ketones, ur NEGATIVE NEGATIVE mg/dL   Protein, ur NEGATIVE NEGATIVE mg/dL   Urobilinogen, UA 0.2 0.0 - 1.0 mg/dL   Nitrite NEGATIVE NEGATIVE   Leukocytes, UA LARGE (A) NEGATIVE  Urine microscopic-add on     Status: Abnormal   Collection Time: 03/21/14  9:30 AM  Result Value Ref Range  Squamous Epithelial / LPF RARE RARE   WBC, UA 3-6 <3 WBC/hpf   RBC / HPF 0-2 <3 RBC/hpf   Bacteria, UA FEW (A) RARE  Pregnancy, urine POC     Status: None   Collection Time: 03/21/14  9:47 AM  Result Value Ref Range   Preg Test, Ur NEGATIVE NEGATIVE    Comment:        THE SENSITIVITY OF THIS METHODOLOGY IS >24 mIU/mL   Wet prep, genital     Status: Abnormal   Collection Time: 03/21/14 10:23 AM  Result Value Ref Range   Yeast Wet Prep HPF POC NONE SEEN NONE SEEN   Trich, Wet Prep NONE SEEN NONE SEEN   Clue Cells Wet Prep HPF POC FEW (A) NONE SEEN   WBC, Wet Prep HPF POC NONE SEEN NONE SEEN    Comment: FEW BACTERIA SEEN    Review of Systems  Constitutional: Negative for fever and chills.  Genitourinary: Negative for dysuria.       Odorous discharge.    Physical  Exam   Blood pressure 121/76, pulse 89, temperature 98.2 F (36.8 C), resp. rate 18.  Physical Exam  Constitutional: She is oriented to person, place, and time. She appears well-developed and well-nourished. No distress.  HENT:  Head: Normocephalic.  Eyes: Pupils are equal, round, and reactive to light.  Neck: Neck supple.  Genitourinary:  Wet prep collected by Patient.  Patient refused speculum exam.   Musculoskeletal: Normal range of motion.  Neurological: She is alert and oriented to person, place, and time.  Skin: Skin is warm. She is not diaphoretic.  Psychiatric: Her behavior is normal.    MAU Course  Procedures  None  MDM Wet prep UA  Urine culture pending    Assessment and Plan   A:  1. UTI (lower urinary tract infection)   2. Yeast vaginitis     P:  Discharge home in stable condition  RX: Diflucan, and Bactrim Urine culture pending Return to MAU as needed, if symptoms worsen List of local OB contact info given.   Lezlie Lye, NP 03/21/2014 10:27 AM

## 2014-03-21 NOTE — MAU Note (Signed)
Pt presents to MAU with complaints of vaginal itching, and a vaginal discharge with an odor since Thursday.

## 2014-03-21 NOTE — Discharge Instructions (Signed)

## 2014-03-22 LAB — URINE CULTURE
Colony Count: 95000
Special Requests: NORMAL

## 2014-06-09 ENCOUNTER — Emergency Department (INDEPENDENT_AMBULATORY_CARE_PROVIDER_SITE_OTHER)
Admission: EM | Admit: 2014-06-09 | Discharge: 2014-06-09 | Disposition: A | Discharge status: Home / Self Care | Payer: Medicare Other | Source: Home / Self Care | Attending: Family Medicine | Admitting: Family Medicine

## 2014-06-09 ENCOUNTER — Encounter (HOSPITAL_COMMUNITY): Payer: Self-pay

## 2014-06-09 DIAGNOSIS — S65211A Laceration of superficial palmar arch of right hand, initial encounter: Secondary | ICD-10-CM | POA: Diagnosis not present

## 2014-06-09 NOTE — Discharge Instructions (Signed)
Keep clean and dry, follow instructions for wound care. Return as needed.

## 2014-06-09 NOTE — ED Notes (Signed)
Laceration to right hand thumb/index web space while fixing her evening meal; here w her care giver/ bleeding controlled at present

## 2014-06-09 NOTE — ED Provider Notes (Addendum)
CSN: [id number]     Arrival date & time 06/09/14  1859 History   First MD Initiated Contact with Patient 06/09/14 1942     Chief Complaint  Patient presents with  . Extremity Laceration   (Consider location/radiation/quality/duration/timing/severity/associated sxs/prior Treatment) Patient is a 24 y.o. female presenting with skin laceration. The history is provided by the patient and a caregiver.  Laceration Location:  Hand Hand laceration location:  R palm Length (cm):  1.5 Depth:  Cutaneous Quality: straight   Bleeding: controlled   Laceration mechanism:  Metal edge (lid from metal canwhile in kitchen.) Pain details:    Severity:  Mild   Progression:  Unchanged Foreign body present:  No foreign bodies Tetanus status:  Up to date   Past Medical History  Diagnosis Date  . ADD (attention deficit disorder)   . Screening for mental disease/developmental disorder   . Hearing loss   . Attention deficit disorder   . ADHD (attention deficit hyperactivity disorder)    Past Surgical History  Procedure Laterality Date  . Back surgery     Family History  Problem Relation Age of Onset  . Seizures Mother   . Diabetes Father    History  Substance Use Topics  . Smoking status: Never Smoker   . Smokeless tobacco: Never Used  . Alcohol Use: No   OB History    Gravida Para Term Preterm AB TAB SAB Ectopic Multiple Living   0 0 0 0 0 0 0 0 0 0      Review of Systems  Constitutional: Negative.   Skin: Positive for wound.    Allergies  Review of patient's allergies indicates no known allergies.  Home Medications   Prior to Admission medications   Medication Sig Start Date End Date Taking? Authorizing Provider  ciprofloxacin (CIPRO) 500 MG tablet Take 500 mg by mouth 2 (two) times daily.   Yes Historical Provider, MD  metroNIDAZOLE (FLAGYL) 500 MG tablet Take 500 mg by mouth 3 (three) times daily.   Yes Historical Provider, MD  CALCIUM PO Take 1 capsule by mouth 2 (two) times  daily.    Historical Provider, MD  CRANBERRY EXTRACT PO Take by mouth 2 (two) times daily.    Historical Provider, MD  fluconazole (DIFLUCAN) 150 MG tablet Take 1 tablet (150 mg total) by mouth daily. Take first dose today, take 2nd dose in three days. 03/21/14   Lezlie Lye, NP  guanFACINE (INTUNIV) 1 MG TB24 Take by mouth daily.    Historical Provider, MD  lisdexamfetamine (VYVANSE) 70 MG capsule Take 50 mg by mouth every morning.     Historical Provider, MD  loratadine (CLARITIN) 10 MG tablet Take 10 mg by mouth daily.    Historical Provider, MD  LORazepam (ATIVAN) 1 MG tablet Take 1 mg by mouth daily as needed for anxiety.    Historical Provider, MD  Multiple Vitamins-Minerals (MULTIVITAMIN PO) Take by mouth 2 (two) times daily.    Historical Provider, MD  risperiDONE (RISPERDAL) 0.5 MG tablet Take 0.5 mg by mouth 2 (two) times daily.    Historical Provider, MD   Pulse 98  Temp(Src) 97.8 F (36.6 C) (Oral)  Resp 16  SpO2 97% Physical Exam  Constitutional: She appears well-developed and well-nourished.  Musculoskeletal: Normal range of motion. She exhibits no tenderness.       Hands: Neurological: She is alert.  Skin: Skin is warm and dry.  Nursing note and vitals reviewed.   ED Course  LACERATION REPAIR Date/Time:  06/09/2014 7:55 PM Performed by: Billy Fischer Authorized by: Ihor Gully D Consent: Verbal consent obtained. Risks and benefits: risks, benefits and alternatives were discussed Consent given by: guardian Body area: upper extremity Location details: right hand Laceration length: 1.5 cm Foreign bodies: no foreign bodies Tendon involvement: none Nerve involvement: none Vascular damage: no Preparation: Patient was prepped and draped in the usual sterile fashion. Amount of cleaning: standard Debridement: none Degree of undermining: none Skin closure: glue Approximation: close Approximation difficulty: simple Dressing: gauze roll Patient tolerance: Patient  tolerated the procedure well with no immediate complications   (including critical care time) Labs Review Labs Reviewed - No data to display  Imaging Review No results found.   MDM   1. Laceration of superficial palmar arch of right hand, initial encounter        Billy Fischer, MD 06/09/14 2020  Billy Fischer, MD 06/09/14 2021

## 2014-07-20 DIAGNOSIS — N910 Primary amenorrhea: Secondary | ICD-10-CM | POA: Diagnosis not present

## 2014-07-20 DIAGNOSIS — Z01419 Encounter for gynecological examination (general) (routine) without abnormal findings: Secondary | ICD-10-CM | POA: Diagnosis not present

## 2014-08-12 DIAGNOSIS — H4423 Degenerative myopia, bilateral: Secondary | ICD-10-CM | POA: Diagnosis not present

## 2014-08-12 DIAGNOSIS — H5034 Intermittent alternating exotropia: Secondary | ICD-10-CM | POA: Diagnosis not present

## 2014-08-12 DIAGNOSIS — F819 Developmental disorder of scholastic skills, unspecified: Secondary | ICD-10-CM | POA: Diagnosis not present

## 2014-08-14 DIAGNOSIS — N910 Primary amenorrhea: Secondary | ICD-10-CM | POA: Diagnosis not present

## 2014-08-14 DIAGNOSIS — N858 Other specified noninflammatory disorders of uterus: Secondary | ICD-10-CM | POA: Diagnosis not present

## 2014-08-24 DIAGNOSIS — E221 Hyperprolactinemia: Secondary | ICD-10-CM | POA: Insufficient documentation

## 2014-08-26 DIAGNOSIS — E221 Hyperprolactinemia: Secondary | ICD-10-CM | POA: Diagnosis not present

## 2014-08-26 DIAGNOSIS — Q8789 Other specified congenital malformation syndromes, not elsewhere classified: Secondary | ICD-10-CM | POA: Diagnosis not present

## 2014-08-26 DIAGNOSIS — Q868 Other congenital malformation syndromes due to known exogenous causes: Secondary | ICD-10-CM | POA: Insufficient documentation

## 2014-08-26 DIAGNOSIS — N910 Primary amenorrhea: Secondary | ICD-10-CM | POA: Diagnosis not present

## 2014-08-26 DIAGNOSIS — Z01411 Encounter for gynecological examination (general) (routine) with abnormal findings: Secondary | ICD-10-CM | POA: Diagnosis not present

## 2014-09-03 DIAGNOSIS — Z124 Encounter for screening for malignant neoplasm of cervix: Secondary | ICD-10-CM | POA: Diagnosis not present

## 2014-09-04 DIAGNOSIS — N760 Acute vaginitis: Secondary | ICD-10-CM | POA: Diagnosis not present

## 2014-09-13 ENCOUNTER — Ambulatory Visit (INDEPENDENT_AMBULATORY_CARE_PROVIDER_SITE_OTHER): Payer: Medicare Other | Admitting: Family Medicine

## 2014-09-13 VITALS — BP 106/68 | HR 89 | Temp 98.6°F | Resp 18 | Ht 63.5 in | Wt 178.0 lb

## 2014-09-13 DIAGNOSIS — H6122 Impacted cerumen, left ear: Secondary | ICD-10-CM | POA: Diagnosis not present

## 2014-09-13 DIAGNOSIS — H60392 Other infective otitis externa, left ear: Secondary | ICD-10-CM | POA: Diagnosis not present

## 2014-09-13 MED ORDER — NEOMYCIN-POLYMYXIN-HC 3.5-10000-1 OT SOLN: 4.0000 [drp] | Freq: Three times a day (TID) | OTIC | Status: DC

## 2014-09-13 NOTE — Progress Notes (Signed)
Subjective:  This chart was scribed for Cheryl Ray, MD by Cheryl Dominguez, ED Scribe. This patient was seen in room 6 and the patient's care was started at 12:15 PM.   Patient ID: Cheryl Dominguez, female    DOB: 11-Mar-1990, 24 y.o.   MRN: [id number]  HPI Chief Complaint  Patient presents with  . Ear Pain    left ear -today  wears hearing aids    HPI Comments: Cheryl Dominguez is a 24 y.o. 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 Followed by Dr. Krista Dominguez with Cheryl Dominguez and PCP Cheryl Pilar, MD. She wears hearing aids  and present today with left ear pain  Pt is brought in by weekend caregiver.  Pt reports left ear pain described as soreness that began today. No ear discharge noticed. Per caregiver, pt wears hearing aids and tends to develop cerumen build up often. They tried debrox last week to remove wax without relief. No pain with debrox last week. She denies fever, chills, cough sore throat. She denies change in activity.  Mother states Cheryl Dominguez have left the practice.   Patient Active Problem List   Diagnosis Date Noted  . UTI (urinary tract infection) 02/22/2014  . Seizures 06/26/2012  . ADD (attention deficit disorder)    Past Medical History  Diagnosis Date  . ADD (attention deficit disorder)   . Screening for mental disease/developmental disorder   . Hearing loss   . Attention deficit disorder   . ADHD (attention deficit hyperactivity disorder)    Past Surgical History  Procedure Laterality Date  . Back surgery     No Known Allergies Prior to Admission medications   Medication Sig Start Date End Date Taking? Authorizing Provider  CALCIUM PO Take 1 capsule by mouth 2 (two) times daily.   Yes Historical Provider, MD  CRANBERRY EXTRACT PO Take by mouth 2 (two) times daily.   Yes Historical Provider, MD    guanFACINE (INTUNIV) 1 MG TB24 Take by mouth daily.   Yes Historical Provider, MD  lisdexamfetamine (VYVANSE) 40 MG capsule Take 40 mg by mouth every morning.   Yes Historical Provider, MD  loratadine (CLARITIN) 10 MG tablet Take 10 mg by mouth daily.   Yes Historical Provider, MD  Multiple Vitamins-Minerals (MULTIVITAMIN PO) Take by mouth 2 (two) times daily.   Yes Historical Provider, MD  norethindrone-ethinyl estradiol-iron (MICROGESTIN FE,GILDESS FE,LOESTRIN FE) 1.5-30 MG-MCG tablet Take 1 tablet by mouth daily.   Yes Historical Provider, MD  polyethylene glycol (MIRALAX / GLYCOLAX) packet Take 17 g by mouth daily.   Yes Historical Provider, MD  risperiDONE (RISPERDAL) 0.5 MG tablet Take 0.5 mg by mouth 2 (two) times daily.   Yes Historical Provider, MD  ciprofloxacin (CIPRO) 500 MG tablet Take 500 mg by mouth 2 (two) times daily.    Historical Provider, MD  fluconazole (DIFLUCAN) 150 MG tablet Take 1 tablet (150 mg total) by mouth daily. Take first dose today, take 2nd dose in three days. Patient not taking: Reported on 09/13/2014 03/21/14   Lezlie Lye, NP  lisdexamfetamine (VYVANSE) 70 MG capsule Take 50 mg by mouth every morning.     Historical Provider, MD  LORazepam (ATIVAN) 1 MG tablet Take 1 mg by mouth daily as needed for anxiety.    Historical Provider, MD  metroNIDAZOLE (FLAGYL) 500 MG tablet Take 500  mg by mouth 3 (three) times daily.    Historical Provider, MD   Social History   Social History  . Marital Status: Single    Spouse Name: N/A  . Number of Children: 0  . Years of Education: 12   Occupational History  .      works at [employer]   Social History Main Topics  . Smoking status: Never Smoker   . Smokeless tobacco: Never Used  . Alcohol Use: No  . Drug Use: No  . Sexual Activity: No   Other Topics Concern  . Not on file   Social History Narrative   Patient lives at home with her grandmother [name]. Patient has 12th grade education.    Right  handed.   Caffeine- Couple times a week.- One   Review of Systems  Constitutional: Negative for fever, chills and activity change.  HENT: Positive for ear pain. Negative for congestion, ear discharge and sore throat.   Respiratory: Negative for cough.    Objective:   Physical Exam  HENT:  Mouth/Throat: Oropharynx is clear and moist. No posterior oropharyngeal erythema.  Right ear- Narrowed canal with some adherent cerumen yellow no impaction. Visualized TM, pearly gray. Left ear- canal also narrow, slight edema light yellow cerumen unable to visualize TM through cerumen and narrowing of canal. Slight erythema of canal as well.   Cardiovascular: Normal rate, regular rhythm and normal heart sounds.  Exam reveals no gallop and no friction rub.   No murmur heard. Pulmonary/Chest: Effort normal and breath sounds normal. No respiratory distress. She has no wheezes. She has no rales.  Vitals reviewed.  Filed Vitals:   09/13/14 1201  BP: 106/68  Pulse: 89  Temp: 98.6 F (37 C)  TempSrc: Oral  Resp: 18  Height: 5' 3.5" (1.613 m)  Weight: 178 lb (80.74 kg)  SpO2: 98%    Assessment & Plan:   Cheryl Dominguez is a 24 y.o. female Otitis, externa, infective, left - Plan: neomycin-polymyxin-hydrocortisone (CORTISPORIN) otic solution  Cerumen impaction, left  Suspected component of cerumen impaction now with some canal edema, discomfort with early otitis externa.   -start cortisporin otic, leave out aid for now.   -recheck here or PCP in few days if not improving, and consider lavage if still unable to visualize TM.   -RTC precautions.   Meds ordered this encounter  Medications  . lisdexamfetamine (VYVANSE) 40 MG capsule    Sig: Take 40 mg by mouth every morning.  . norethindrone-ethinyl estradiol-iron (MICROGESTIN FE,GILDESS FE,LOESTRIN FE) 1.5-30 MG-MCG tablet    Sig: Take 1 tablet by mouth daily.  . polyethylene glycol (MIRALAX / GLYCOLAX) packet    Sig: Take 17 g by mouth daily.    [name] Kitchen neomycin-polymyxin-hydrocortisone (CORTISPORIN) otic solution    Sig: Place 4 drops into the left ear 3 (three) times daily. For 10 days.    Dispense:  10 mL    Refill:  0   Patient Instructions  Possible early left external ear infection.  Start drops - three times per day.  If not improving in next few days - return here or primary care provider for recheck.   There is also some wax in that ear that may need to be flushed out.  If not improved this week - can try to flush the wax out in office.   Return to the clinic or go to the nearest emergency room if any of your symptoms worsen or new symptoms occur.  Cerumen Impaction A cerumen impaction is when the wax in your ear forms a plug. This plug usually causes reduced hearing. Sometimes it also causes an earache or dizziness. Removing a cerumen impaction can be difficult and painful. The wax sticks to the ear canal. The canal is sensitive and bleeds easily. If you try to remove a heavy wax buildup with a cotton tipped swab, you may push it in further. Irrigation with water, suction, and small ear curettes may be used to clear out the wax. If the impaction is fixed to the skin in the ear canal, ear drops may be needed for a few days to loosen the wax. People who build up a lot of wax frequently can use ear wax removal products available in your local drugstore. SEEK MEDICAL CARE IF:  You develop an earache, increased hearing loss, or marked dizziness. Document Released: 01/27/2004 Document Revised: 03/13/2011 Document Reviewed: 03/18/2009 [facility] Patient Information 2015 [place], [place]. This information is not intended to replace advice given to you by your health care provider. Make sure you discuss any questions you have with your health care provider. Otitis Externa Otitis externa is a bacterial or fungal infection of the outer ear canal. This is the area from the eardrum to the outside of the ear. Otitis externa is sometimes called  "swimmer's ear." CAUSES  Possible causes of infection include:  Swimming in dirty water.  Moisture remaining in the ear after swimming or bathing.  Mild injury (trauma) to the ear.  Objects stuck in the ear (foreign body).  Cuts or scrapes (abrasions) on the outside of the ear. SIGNS AND SYMPTOMS  The first symptom of infection is often itching in the ear canal. Later signs and symptoms may include swelling and redness of the ear canal, ear pain, and yellowish-white fluid (pus) coming from the ear. The ear pain may be worse when pulling on the earlobe. DIAGNOSIS  Your health care provider will perform a physical exam. A sample of fluid may be taken from the ear and examined for bacteria or fungi. TREATMENT  Antibiotic ear drops are often given for 10 to 14 days. Treatment may also include pain medicine or corticosteroids to reduce itching and swelling. HOME CARE INSTRUCTIONS   Apply antibiotic ear drops to the ear canal as prescribed by your health care provider.  Take medicines only as directed by your health care provider.  If you have diabetes, follow any additional treatment instructions from your health care provider.  Keep all follow-up visits as directed by your health care provider. PREVENTION   Keep your ear dry. Use the corner of a towel to absorb water out of the ear canal after swimming or bathing.  Avoid scratching or putting objects inside your ear. This can damage the ear canal or remove the protective wax that lines the canal. This makes it easier for bacteria and fungi to grow.  Avoid swimming in lakes, polluted water, or poorly chlorinated pools.  You may use ear drops made of rubbing alcohol and vinegar after swimming. Combine equal parts of white vinegar and alcohol in a bottle. Put 3 or 4 drops into each ear after swimming. SEEK MEDICAL CARE IF:   You have a fever.  Your ear is still red, swollen, painful, or draining pus after 3 days.  Your redness,  swelling, or pain gets worse.  You have a severe headache.  You have redness, swelling, pain, or tenderness in the area behind your ear. MAKE SURE YOU:  Understand these instructions.  Will watch your condition.  Will get help right away if you are not doing well or get worse. Document Released: 12/19/2004 Document Revised: 05/05/2013 Document Reviewed: 01/05/2011 [facility] Patient Information 2015 [place], [place]. This information is not intended to replace advice given to you by your health care provider. Make sure you discuss any questions you have with your health care provider.     I personally performed the services described in this documentation, which was scribed in my presence. The recorded information has been reviewed and considered, and addended by me as needed.

## 2014-09-13 NOTE — Patient Instructions (Signed)
Possible early left external ear infection.  Start drops - three times per day.  If not improving in next few days - return here or primary care provider for recheck.   There is also some wax in that ear that may need to be flushed out.  If not improved this week - can try to flush the wax out in office.   Return to the clinic or go to the nearest emergency room if any of your symptoms worsen or new symptoms occur.   Cerumen Impaction A cerumen impaction is when the wax in your ear forms a plug. This plug usually causes reduced hearing. Sometimes it also causes an earache or dizziness. Removing a cerumen impaction can be difficult and painful. The wax sticks to the ear canal. The canal is sensitive and bleeds easily. If you try to remove a heavy wax buildup with a cotton tipped swab, you may push it in further. Irrigation with water, suction, and small ear curettes may be used to clear out the wax. If the impaction is fixed to the skin in the ear canal, ear drops may be needed for a few days to loosen the wax. People who build up a lot of wax frequently can use ear wax removal products available in your local drugstore. SEEK MEDICAL CARE IF:  You develop an earache, increased hearing loss, or marked dizziness. Document Released: 01/27/2004 Document Revised: 03/13/2011 Document Reviewed: 03/18/2009 [facility] Patient Information 2015 [place], [place]. This information is not intended to replace advice given to you by your health care provider. Make sure you discuss any questions you have with your health care provider. Otitis Externa Otitis externa is a bacterial or fungal infection of the outer ear canal. This is the area from the eardrum to the outside of the ear. Otitis externa is sometimes called "swimmer's ear." CAUSES  Possible causes of infection include:  Swimming in dirty water.  Moisture remaining in the ear after swimming or bathing.  Mild injury (trauma) to the ear.  Objects stuck  in the ear (foreign body).  Cuts or scrapes (abrasions) on the outside of the ear. SIGNS AND SYMPTOMS  The first symptom of infection is often itching in the ear canal. Later signs and symptoms may include swelling and redness of the ear canal, ear pain, and yellowish-white fluid (pus) coming from the ear. The ear pain may be worse when pulling on the earlobe. DIAGNOSIS  Your health care provider will perform a physical exam. A sample of fluid may be taken from the ear and examined for bacteria or fungi. TREATMENT  Antibiotic ear drops are often given for 10 to 14 days. Treatment may also include pain medicine or corticosteroids to reduce itching and swelling. HOME CARE INSTRUCTIONS   Apply antibiotic ear drops to the ear canal as prescribed by your health care provider.  Take medicines only as directed by your health care provider.  If you have diabetes, follow any additional treatment instructions from your health care provider.  Keep all follow-up visits as directed by your health care provider. PREVENTION   Keep your ear dry. Use the corner of a towel to absorb water out of the ear canal after swimming or bathing.  Avoid scratching or putting objects inside your ear. This can damage the ear canal or remove the protective wax that lines the canal. This makes it easier for bacteria and fungi to grow.  Avoid swimming in lakes, polluted water, or poorly chlorinated pools.  You may use  ear drops made of rubbing alcohol and vinegar after swimming. Combine equal parts of white vinegar and alcohol in a bottle. Put 3 or 4 drops into each ear after swimming. SEEK MEDICAL CARE IF:   You have a fever.  Your ear is still red, swollen, painful, or draining pus after 3 days.  Your redness, swelling, or pain gets worse.  You have a severe headache.  You have redness, swelling, pain, or tenderness in the area behind your ear. MAKE SURE YOU:   Understand these instructions.  Will watch  your condition.  Will get help right away if you are not doing well or get worse. Document Released: 12/19/2004 Document Revised: 05/05/2013 Document Reviewed: 01/05/2011 [facility] Patient Information 2015 [place], [place]. This information is not intended to replace advice given to you by your health care provider. Make sure you discuss any questions you have with your health care provider.

## 2014-09-18 ENCOUNTER — Telehealth: Payer: Self-pay

## 2014-09-18 NOTE — Telephone Encounter (Signed)
Spoke with pt's caregiver. She is going to bring her in to be seen tomorrow to recheck her ear. I advised this would be a great idea to recheck and see what is going on.

## 2014-09-18 NOTE — Telephone Encounter (Signed)
[phone]   Dr. Tamala Julian   Patient has recurring ear aches, scheduled an appointment Nov 4th.

## 2014-09-19 ENCOUNTER — Ambulatory Visit (INDEPENDENT_AMBULATORY_CARE_PROVIDER_SITE_OTHER): Payer: Medicare Other | Admitting: Physician Assistant

## 2014-09-19 VITALS — BP 112/70 | HR 86 | Temp 98.5°F | Resp 14 | Ht 63.5 in | Wt 179.0 lb

## 2014-09-19 DIAGNOSIS — H60392 Other infective otitis externa, left ear: Secondary | ICD-10-CM | POA: Diagnosis not present

## 2014-09-19 NOTE — Progress Notes (Signed)
   Subjective:    Patient ID: Cheryl Dominguez, female    DOB: 09-16-1990, 24 y.o.   MRN: [id number]  Chief Complaint  Patient presents with  . Ear Pain    left ear pain follow up   Medications, allergies, past medical history, surgical history, family history, social history and problem list reviewed and updated.  HPI  43 yof returns for ear recheck.   Seen here 6 days ago. Was having left ear pain. She has hx developmental delay and wears bilateral hearing aids. At that time she was thought to have possible early otitis externa and started on drops. Was not able to get good visualization of middle ear due to edema.   She returns today with caregiver. They mention she continued to have constant left ear pain for several days after last visit. For last few days though the pain has improved and she only feels pain may be once daily. Denies fevers, denies drainage. Denies st or neck pain.   Has been applying drops tid and not wearing hearing aids.   Review of Systems See HPI     Objective:   Physical Exam  Constitutional: She is oriented to person, place, and time. She appears well-developed and well-nourished.  Non-toxic appearance. She does not have a sickly appearance. She does not appear ill. No distress.  BP 112/70 mmHg  Pulse 86  Temp(Src) 98.5 F (36.9 C) (Oral)  Resp 14  Ht 5' 3.5" (1.613 m)  Wt 179 lb (81.194 kg)  BMI 31.21 kg/m2  SpO2 98%   HENT:  Right Ear: Tympanic membrane and ear canal normal.  Left Ear: Tympanic membrane normal. No drainage or tenderness.  Narrow canals bilaterally.   Left canal with mild swelling and irritation along inferior portion of canal. No pain with ear manipulation. No drainage. Small amnt cerumen. No impaction.   Lymphadenopathy:       Head (right side): Submandibular adenopathy present. No submental, no tonsillar and no preauricular adenopathy present.       Head (left side): Submandibular adenopathy present. No submental, no  tonsillar and no preauricular adenopathy present.  Neurological: She is alert and oriented to person, place, and time.      Assessment & Plan:   Otitis, externa, infective, left --healing well, pain and appearance improved --no fevers, neck stiffness, mastoid ttp --continue drops tid, finish 10 day course --rtc with worsening or persistent fevers  Julieta Gutting, PA-C Physician Assistant-Certified [facility]  09/19/2014 10:11 AM

## 2014-09-25 ENCOUNTER — Ambulatory Visit (INDEPENDENT_AMBULATORY_CARE_PROVIDER_SITE_OTHER): Payer: Medicare Other | Admitting: Physician Assistant

## 2014-09-25 VITALS — BP 132/82 | HR 86 | Temp 98.5°F | Resp 16 | Ht 63.5 in | Wt 186.0 lb

## 2014-09-25 DIAGNOSIS — H60391 Other infective otitis externa, right ear: Secondary | ICD-10-CM

## 2014-09-25 NOTE — Progress Notes (Signed)
[facility] [address], [place] [address] [phone]  Date:  09/25/2014   Name:  Cheryl Dominguez   DOB:  08-25-1990   MRN:  [id number]  PCP:  Saralyn Pilar, MD    History of Present Illness:  Cheryl Dominguez is a 24 y.o. female patient who presents to [facility] for chief complaint of ear pain at right side.  Started 1 week ago, and has worsened.  She was treated here 6 days ago for otitis externa of her left ear with cortisporin HC.  She states that this ear pain has completely resolved with the drops, but now the right ear is painful.  No drainage from the ear.  No fever, n/v or dysequilibrium.  Mother placed debrox in both ears.  Patient Active Problem List   Diagnosis Date Noted  . UTI (urinary tract infection) 02/22/2014  . Seizures 06/26/2012  . ADD (attention deficit disorder)     Past Medical History  Diagnosis Date  . ADD (attention deficit disorder)   . Screening for mental disease/developmental disorder   . Hearing loss   . Attention deficit disorder   . ADHD (attention deficit hyperactivity disorder)     Past Surgical History  Procedure Laterality Date  . Back surgery      Social History  Substance Use Topics  . Smoking status: Never Smoker   . Smokeless tobacco: Never Used  . Alcohol Use: No    Family History  Problem Relation Age of Onset  . Seizures Mother   . Diabetes Father     No Known Allergies  Medication list has been reviewed and updated.  Current Outpatient Prescriptions on File Prior to Visit  Medication Sig Dispense Refill  . CALCIUM PO Take 1 capsule by mouth 2 (two) times daily.    [name] [employer] CRANBERRY EXTRACT PO Take by mouth 2 (two) times daily.    [name] Kitchen guanFACINE (INTUNIV) 1 MG TB24 Take by mouth daily.    [name] Kitchen lisdexamfetamine (VYVANSE) 40 MG capsule Take 40 mg by mouth every morning.    . loratadine (CLARITIN) 10 MG tablet Take 10 mg by mouth daily.    [name] Kitchen LORazepam (ATIVAN) 1 MG tablet Take 1 mg by mouth daily as  needed for anxiety.    . Multiple Vitamins-Minerals (MULTIVITAMIN PO) Take by mouth 2 (two) times daily.    . norethindrone-ethinyl estradiol-iron (MICROGESTIN FE,GILDESS FE,LOESTRIN FE) 1.5-30 MG-MCG tablet Take 1 tablet by mouth daily.    . polyethylene glycol (MIRALAX / GLYCOLAX) packet Take 17 g by mouth daily.    . risperiDONE (RISPERDAL) 0.5 MG tablet Take 0.5 mg by mouth 2 (two) times daily.    [name] Kitchen lisdexamfetamine (VYVANSE) 70 MG capsule Take 50 mg by mouth every morning.     . neomycin-polymyxin-hydrocortisone (CORTISPORIN) otic solution Place 4 drops into the left ear 3 (three) times daily. For 10 days. (Patient not taking: Reported on 09/25/2014) 10 mL 0   No current facility-administered medications on file prior to visit.    ROS ROS otherwise unremarkable unless listed above.   Physical Examination: BP 132/82 mmHg  Pulse 86  Temp(Src) 98.5 F (36.9 C) (Oral)  Resp 16  Ht 5' 3.5" (1.613 m)  Wt 186 lb (84.369 kg)  BMI 32.43 kg/m2  SpO2 96% Ideal Body Weight: Weight in (lb) to have BMI = 25: 143.1  Physical Exam  Constitutional: She is oriented to person, place, and time. She appears well-developed and well-nourished. No distress.  HENT:  Head: Normocephalic and atraumatic.  Right Ear: External ear normal.  Left Ear: External ear and ear canal normal.  Nose: Mucosal edema and rhinorrhea present. Right sinus exhibits no maxillary sinus tenderness and no frontal sinus tenderness. Left sinus exhibits no maxillary sinus tenderness and no frontal sinus tenderness.  Mouth/Throat: No uvula swelling. No oropharyngeal exudate, posterior oropharyngeal edema or posterior oropharyngeal erythema.  Right auricular tender with pulling.  Canal is narrow with inflammation and redness.  TM with light colored cerumen.   Left canal with normal TM, with minimal cerumen.    Eyes: Conjunctivae and EOM are normal. Pupils are equal, round, and reactive to light.  Cardiovascular: Normal rate and  regular rhythm.  Exam reveals no gallop, no distant heart sounds and no friction rub.   No murmur heard. Pulmonary/Chest: Effort normal. No respiratory distress. She has no decreased breath sounds. She has no wheezes. She has no rhonchi.  Lymphadenopathy:       Head (right side): No submandibular, no tonsillar, no preauricular and no posterior auricular adenopathy present.       Head (left side): No submandibular, no tonsillar, no preauricular and no posterior auricular adenopathy present.  Neurological: She is alert and oriented to person, place, and time.  Skin: She is not diaphoretic.  Psychiatric: She has a normal mood and affect. Her behavior is normal.     Assessment and Plan: 24 year old female is here today with PMH listed above is here today for chief complaint of right ear pain.  Debrox likely making that looser appearance.  Otitis, externa, infective, right Repeat cortisporin treatment at the right ear.  Tylenol for pain.  Ivar Drape, PA-C [facility] 09/25/2014 6:18 PM

## 2014-09-25 NOTE — Patient Instructions (Signed)
Please use the cortisporin HC at the right ear at this time.  Please return during the weekend (Sunday) if she is not having any improvement and sooner for worsening.    Otitis Externa Otitis externa is a germ infection in the outer ear. The outer ear is the area from the eardrum to the outside of the ear. Otitis externa is sometimes called "swimmer's ear." HOME CARE  Put drops in the ear as told by your doctor.  Only take medicine as told by your doctor.  If you have diabetes, your doctor may give you more directions. Follow your doctor's directions.  Keep all doctor visits as told. To avoid another infection:  Keep your ear dry. Use the corner of a towel to dry your ear after swimming or bathing.  Avoid scratching or putting things inside your ear.  Avoid swimming in lakes, dirty water, or pools that use a chemical called chlorine poorly.  You may use ear drops after swimming. Combine equal amounts of white vinegar and alcohol in a bottle. Put 3 or 4 drops in each ear. GET HELP IF:   You have a fever.  Your ear is still red, puffy (swollen), or painful after 3 days.  You still have yellowish-white fluid (pus) coming from the ear after 3 days.  Your redness, puffiness, or pain gets worse.  You have a really bad headache.  You have redness, puffiness, pain, or tenderness behind your ear. MAKE SURE YOU:   Understand these instructions.  Will watch your condition.  Will get help right away if you are not doing well or get worse. Document Released: 06/07/2007 Document Revised: 05/05/2013 Document Reviewed: 01/05/2011 [facility] Patient Information 2015 [place], [place]. This information is not intended to replace advice given to you by your health care provider. Make sure you discuss any questions you have with your health care provider.

## 2014-09-27 ENCOUNTER — Ambulatory Visit (INDEPENDENT_AMBULATORY_CARE_PROVIDER_SITE_OTHER): Payer: Medicare Other | Admitting: Physician Assistant

## 2014-09-27 VITALS — BP 106/66 | HR 73 | Temp 98.6°F | Resp 18 | Ht 63.0 in | Wt 179.0 lb

## 2014-09-27 DIAGNOSIS — H6091 Unspecified otitis externa, right ear: Secondary | ICD-10-CM

## 2014-10-02 NOTE — Progress Notes (Signed)
[facility] [address] [place] [address] [phone]  Date:  09/27/2014   Name:  Cheryl Dominguez   DOB:  September 28, 1990   MRN:  [id number]  PCP:  Saralyn Pilar, MD    History of Present Illness:  Cheryl Dominguez is a 24 y.o. female patient who presents to [facility]   For follow up of otitis externa of her right ear.  Patient reports along with caretaker, that her ear feels much better.  Using ear drops, cortisporin  as prescribed.  No complaints from her of ear since last night.  No fever, dysequilibrium or trouble hearing, or drainage.    Patient Active Problem List   Diagnosis Date Noted  . UTI (urinary tract infection) 02/22/2014  . Seizures 06/26/2012  . ADD (attention deficit disorder)     Past Medical History  Diagnosis Date  . ADD (attention deficit disorder)   . Screening for mental disease/developmental disorder   . Hearing loss   . Attention deficit disorder   . ADHD (attention deficit hyperactivity disorder)     Past Surgical History  Procedure Laterality Date  . Back surgery      Social History  Substance Use Topics  . Smoking status: Never Smoker   . Smokeless tobacco: Never Used  . Alcohol Use: No    Family History  Problem Relation Age of Onset  . Seizures Mother   . Diabetes Father     No Known Allergies  Medication list has been reviewed and updated.  Current Outpatient Prescriptions on File Prior to Visit  Medication Sig Dispense Refill  . CALCIUM PO Take 1 capsule by mouth 2 (two) times daily.    [name] Kitchen CRANBERRY EXTRACT PO Take by mouth 2 (two) times daily.    [name] Kitchen guanFACINE (INTUNIV) 1 MG TB24 Take by mouth daily.    [name] Kitchen lisdexamfetamine (VYVANSE) 40 MG capsule Take 40 mg by mouth every morning.    . loratadine (CLARITIN) 10 MG tablet Take 10 mg by mouth daily.    [name] Kitchen LORazepam (ATIVAN) 1 MG tablet Take 1 mg by mouth daily as needed for anxiety.    . Multiple Vitamins-Minerals (MULTIVITAMIN PO) Take by mouth 2 (two) times  daily.    [name] Kitchen neomycin-polymyxin-hydrocortisone (CORTISPORIN) otic solution Place 4 drops into the left ear 3 (three) times daily. For 10 days. 10 mL 0  . norethindrone-ethinyl estradiol-iron (MICROGESTIN FE,GILDESS FE,LOESTRIN FE) 1.5-30 MG-MCG tablet Take 1 tablet by mouth daily.    . polyethylene glycol (MIRALAX / GLYCOLAX) packet Take 17 g by mouth daily.    . risperiDONE (RISPERDAL) 0.5 MG tablet Take 0.5 mg by mouth 2 (two) times daily.    [name] Kitchen lisdexamfetamine (VYVANSE) 70 MG capsule Take 50 mg by mouth every morning.      No current facility-administered medications on file prior to visit.    ROS ROS otherwise unremarkable unless listed above.   Physical Examination: BP 106/66 mmHg  Pulse 73  Temp(Src) 98.6 F (37 C) (Oral)  Resp 18  Ht 5\' 3"  (1.6 m)  Wt 179 lb (81.194 kg)  BMI 31.72 kg/m2  SpO2 98% Ideal Body Weight: Weight in (lb) to have BMI = 25: 140.8  Physical Exam  Constitutional: She is oriented to person, place, and time. She appears well-developed and well-nourished. No distress.  HENT:  Head: Normocephalic and atraumatic.  Right Ear: Tympanic membrane, external ear and ear canal normal.  Left Ear: Tympanic membrane, external ear and ear canal  normal.  No pain with movement of ear or otoscope.  No erythema or swelling at the ear canal.    Eyes: Conjunctivae and EOM are normal. Pupils are equal, round, and reactive to light.  Cardiovascular: Normal rate.   Pulmonary/Chest: Effort normal. No respiratory distress.  Neurological: She is alert and oriented to person, place, and time.  Skin: She is not diaphoretic.  Psychiatric: She has a normal mood and affect. Her behavior is normal.     Assessment and Plan: 24 year old female is here today for follow up of otitis externa. -Continue ear drops.  This appears less inflamed and healing well.  No rtc unless worsens.  Otitis externa, right  Ivar Drape, PA-C [facility] 10/02/2014 3:01 PM

## 2014-11-06 ENCOUNTER — Ambulatory Visit (INDEPENDENT_AMBULATORY_CARE_PROVIDER_SITE_OTHER): Payer: Medicare Other | Admitting: Family Medicine

## 2014-11-06 ENCOUNTER — Encounter: Payer: Self-pay | Admitting: Family Medicine

## 2014-11-06 VITALS — BP 94/69 | HR 89 | Temp 98.4°F | Resp 16 | Wt 178.6 lb

## 2014-11-06 DIAGNOSIS — H9193 Unspecified hearing loss, bilateral: Secondary | ICD-10-CM

## 2014-11-06 DIAGNOSIS — H6092 Unspecified otitis externa, left ear: Secondary | ICD-10-CM | POA: Diagnosis not present

## 2014-11-06 DIAGNOSIS — E2839 Other primary ovarian failure: Secondary | ICD-10-CM

## 2014-11-06 DIAGNOSIS — J301 Allergic rhinitis due to pollen: Secondary | ICD-10-CM

## 2014-11-06 DIAGNOSIS — F909 Attention-deficit hyperactivity disorder, unspecified type: Secondary | ICD-10-CM

## 2014-11-06 DIAGNOSIS — Z23 Encounter for immunization: Secondary | ICD-10-CM | POA: Diagnosis not present

## 2014-11-06 DIAGNOSIS — K5901 Slow transit constipation: Secondary | ICD-10-CM | POA: Diagnosis not present

## 2014-11-06 DIAGNOSIS — F988 Other specified behavioral and emotional disorders with onset usually occurring in childhood and adolescence: Secondary | ICD-10-CM

## 2014-11-06 MED ORDER — POLYETHYLENE GLYCOL 3350 17 GM/SCOOP PO POWD: 17.0000 g | Freq: Every day | ORAL | Status: DC

## 2014-11-06 NOTE — Progress Notes (Signed)
Subjective:    Patient ID: Cheryl Dominguez, female    DOB: August 07, 1990, 24 y.o.   MRN: [id number]  11/06/2014  recurrent ear infections   HPI This 24 y.o. female presents to establish care.    Last physical:  Not sure; 1.5 years. Pap smear:  5 years ago. Gynecologist Cheryl Dominguez at [facility].  Irregular menses.  Recently had a period; primary ovarian insufficiency. Mammogram:   never Colonoscopy:  never TDAP:  Not sure Influenza: today Eye exam: +glasses; 08/2014 Dental exam:  Less than six weeks.  Primary ovarian insufficiency.  ADD: Vyvanse for ADD; followed by Vyvanse 40mg  daily; [employer]; every 3-4 months.   Allergic Rhintiis: Claritin daily year round.  Constipation: taking Probiotic (Shaklee bifidus and acidophilis) and Miralax.  Daily b.m.    Pediatrician was Dr. Linton Dominguez; then went to [employer]; then went to [employer].    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  Every weekend.  Cheryl Dominguez has been involved medically for past year.    School is year round.    Otitis externa: took beach trip.    Hearing aides both ears.  Went to [employer]; then developed infections.  Urology: in December 2015, vaginal odor.  Then kindly treated but unable to give good samples.  Metronidazole and Diflucan. Recurrent infection with UTI at [facility]. Recurrent vaginal odor; then went to [employer]; urine would show cells at quick test but cx was negative.  Ongoing cycle; repeat abx.  Then went to urology.  No frequency; no incontinence; no dysuria.  Started probiotic; drink more water in May 2016.  Since then, one or two vaginal infections but has improved.    Never change over the holidays.     Review of Systems  Constitutional: Negative for fever, chills, diaphoresis and fatigue.  HENT: Negative for congestion, ear pain, postnasal drip and rhinorrhea.   Eyes: Negative for visual disturbance.  Respiratory: Negative for cough and shortness of breath.   Cardiovascular:  Negative for chest pain, palpitations and leg swelling.  Gastrointestinal: Positive for constipation. Negative for nausea, vomiting, abdominal pain and diarrhea.  Endocrine: Negative for cold intolerance, heat intolerance, polydipsia, polyphagia and polyuria.  Genitourinary: Negative for dysuria, hematuria and vaginal discharge.  Neurological: Negative for dizziness, tremors, seizures, syncope, facial asymmetry, speech difficulty, weakness, light-headedness, numbness and headaches.  Psychiatric/Behavioral: Positive for decreased concentration. Negative for suicidal ideas, sleep disturbance, self-injury and dysphoric mood. The patient is nervous/anxious.     Past Medical History  Diagnosis Date  . ADD (attention deficit disorder)   . Screening for mental disease/developmental disorder   . Hearing loss   . Attention deficit disorder   . ADHD (attention deficit hyperactivity disorder)   . Allergy   . Constipation   . Primary ovarian insufficiency    Past Surgical History  Procedure Laterality Date  . Back surgery      scoliosis; teenage   No Known Allergies Current Outpatient Prescriptions  Medication Sig Dispense Refill  . CALCIUM PO Take 1 capsule by mouth 2 (two) times daily.    [name] Kitchen CRANBERRY EXTRACT PO Take by mouth 2 (two) times daily.    [name] Kitchen guanFACINE (INTUNIV) 1 MG TB24 Take by mouth daily.    [name] Kitchen loratadine (CLARITIN) 10 MG tablet Take 10 mg by mouth daily.    [name] Kitchen LORazepam (ATIVAN) 1 MG tablet Take 1 mg by mouth daily as needed for anxiety.    . Multiple Vitamins-Minerals (MULTIVITAMIN PO) Take by mouth 2 (two) times daily.    [name] Kitchen  risperiDONE (RISPERDAL) 0.5 MG tablet Take 0.5 mg by mouth 2 (two) times daily.    [name] Kitchen lisdexamfetamine (VYVANSE) 40 MG capsule Take 40 mg by mouth every morning.    . lisdexamfetamine (VYVANSE) 70 MG capsule Take 50 mg by mouth every morning.     . neomycin-polymyxin-hydrocortisone (CORTISPORIN) otic solution Place 4 drops into the left ear 3 (three) times  daily. For 10 days. 10 mL 0  . norethindrone-ethinyl estradiol-iron (MICROGESTIN FE,GILDESS FE,LOESTRIN FE) 1.5-30 MG-MCG tablet Take 1 tablet by mouth daily.    . polyethylene glycol powder (GLYCOLAX/MIRALAX) powder Take 17 g by mouth daily. 3350 g 11   No current facility-administered medications for this visit.   Social History   Social History  . Marital Status: Single    Spouse Name: N/A  . Number of Children: 0  . Years of Education: 12   Occupational History  .      works at [employer]   Social History Main Topics  . Smoking status: Never Smoker   . Smokeless tobacco: Never Used  . Alcohol Use: No  . Drug Use: No  . Sexual Activity: No   Other Topics Concern  . Not on file   Social History Narrative   Marital status: single; single; not dating  Equities trader; spends weekends.   Patient lives at home with her grandmother Cheryl Dominguez and [employer].     Education: Patient has 12th grade education.       Employment:  Disability      Education: attends [employer]; M-F 10-4      Tobacco: none      Alcohol: none      Exercise: walking; walks on weekends with caregiver. Physical activity at school; goes to [facility] twice per day.   Right handed.   Caffeine- Couple times a week.- One   Family History  Problem Relation Age of Onset  . Seizures Mother   . Diabetes Father   . Seizures Sister   . Mental retardation Sister        Objective:    BP 94/69 mmHg  Pulse 89  Temp(Src) 98.4 F (36.9 C) (Oral)  Resp 16  Wt 178 lb 9.6 oz (81.012 kg) Physical Exam  Constitutional: She is oriented to person, place, and time. She appears well-developed and well-nourished. No distress.  HENT:  Head: Normocephalic and atraumatic.  Right Ear: External ear normal.  Left Ear: External ear normal.  Nose: Nose normal.  Mouth/Throat: Oropharynx is clear and moist.  Eyes: Conjunctivae and EOM are normal. Pupils are equal, round, and reactive to light.  Neck: Normal  range of motion. Neck supple. Carotid bruit is not present. No thyromegaly present.  Cardiovascular: Normal rate, regular rhythm, normal heart sounds and intact distal pulses.  Exam reveals no gallop and no friction rub.   No murmur heard. Pulmonary/Chest: Effort normal and breath sounds normal. She has no wheezes. She has no rales.  Abdominal: Soft. Bowel sounds are normal. She exhibits no distension and no mass. There is no tenderness. There is no rebound and no guarding.  Lymphadenopathy:    She has no cervical adenopathy.  Neurological: She is alert and oriented to person, place, and time. No cranial nerve deficit.  Skin: Skin is warm and dry. No rash noted. She is not diaphoretic. No erythema. No pallor.  Psychiatric: She has a normal mood and affect. Her behavior is normal.        Assessment & Plan:   1.  ADD (attention deficit disorder)   2. Recurrent otitis externa, left   3. Seasonal allergic rhinitis due to pollen   4. Slow transit constipation   5. Need for prophylactic vaccination and inoculation against influenza   6. Primary ovarian insufficiency   7. Hearing loss, bilateral     1. ADD: stable; followed by psychiatry. 2.  Recurrent otitis externa L: resolved; no evidence of active infection. 3.  Allergic Rhinitis: stable on Claritin daily. 4.  Constipation: Chronic; well controlled with Miralax; rx provided. 5.  Primary ovarian insufficiency: stable; followed by gynecology. 6.  Hearing loss B: wears hearing aides. 7. S/p flu vaccine.   Orders Placed This Encounter  Procedures  . Flu Vaccine QUAD 36+ mos IM   Meds ordered this encounter  Medications  . polyethylene glycol powder (GLYCOLAX/MIRALAX) powder    Sig: Take 17 g by mouth daily.    Dispense:  3350 g    Refill:  11    No Follow-up on file.    Cheryl Dominguez, M.D. [facility] [address] [place], [place]  [address] [phone] phone [phone] fax

## 2014-11-23 ENCOUNTER — Ambulatory Visit (INDEPENDENT_AMBULATORY_CARE_PROVIDER_SITE_OTHER): Payer: Medicare Other | Admitting: Family Medicine

## 2014-11-23 ENCOUNTER — Encounter: Payer: Self-pay | Admitting: Family Medicine

## 2014-11-23 VITALS — BP 99/62 | HR 77 | Temp 98.0°F | Resp 16 | Wt 179.2 lb

## 2014-11-23 DIAGNOSIS — H60392 Other infective otitis externa, left ear: Secondary | ICD-10-CM

## 2014-11-23 DIAGNOSIS — H6092 Unspecified otitis externa, left ear: Secondary | ICD-10-CM | POA: Diagnosis not present

## 2014-11-23 MED ORDER — NEOMYCIN-POLYMYXIN-HC 3.5-10000-1 OT SOLN: 4.0000 [drp] | Freq: Three times a day (TID) | OTIC | Status: DC

## 2014-11-23 NOTE — Patient Instructions (Signed)

## 2014-11-27 ENCOUNTER — Other Ambulatory Visit: Payer: Self-pay | Admitting: Family Medicine

## 2014-12-01 ENCOUNTER — Encounter: Payer: Self-pay | Admitting: Family Medicine

## 2014-12-01 DIAGNOSIS — K5901 Slow transit constipation: Secondary | ICD-10-CM | POA: Insufficient documentation

## 2014-12-01 DIAGNOSIS — J301 Allergic rhinitis due to pollen: Secondary | ICD-10-CM | POA: Insufficient documentation

## 2014-12-01 DIAGNOSIS — H9190 Unspecified hearing loss, unspecified ear: Secondary | ICD-10-CM | POA: Insufficient documentation

## 2014-12-01 DIAGNOSIS — E2839 Other primary ovarian failure: Secondary | ICD-10-CM | POA: Insufficient documentation

## 2014-12-02 NOTE — Progress Notes (Signed)
Subjective:    Patient ID: Cheryl Dominguez, female    DOB: 12-Jul-1990, 24 y.o.   MRN: [id number]  11/23/2014  Ear Pain   HPI This 24 y.o. female presents for evaluation of recurrent L ear pain. Leaving for vacation and concerned about development of L ear pain. S/p ear irrigation by audiologist in past week; now having ear pain. There was concern of piece of hearing aide in canal; audiologist did not find FB. No fever/chills/sweats. No rhinorrhea, nasal congestion sore throat, or cough. NO drainage from ear.   Review of Systems  Constitutional: Negative for fever, chills, diaphoresis and fatigue.  HENT: Positive for ear pain. Negative for ear discharge, postnasal drip, rhinorrhea, sinus pressure, sore throat and trouble swallowing.   Respiratory: Negative for cough and shortness of breath.   Cardiovascular: Negative for chest pain, palpitations and leg swelling.  Gastrointestinal: Negative for nausea, vomiting, abdominal pain, diarrhea and constipation.    Past Medical History  Diagnosis Date  . ADD (attention deficit disorder)   . Screening for mental disease/developmental disorder   . Hearing loss   . Attention deficit disorder   . ADHD (attention deficit hyperactivity disorder)   . Allergy   . Constipation   . Primary ovarian insufficiency    Past Surgical History  Procedure Laterality Date  . Back surgery      scoliosis; teenage   No Known Allergies  Social History   Social History  . Marital Status: Single    Spouse Name: N/A  . Number of Children: 0  . Years of Education: 12   Occupational History  .      works at [employer]   Social History Main Topics  . Smoking status: Never Smoker   . Smokeless tobacco: Never Used  . Alcohol Use: No  . Drug Use: No  . Sexual Activity: No   Other Topics Concern  . Not on file   Social History Narrative   Marital status: single; single; not dating  Equities trader; spends weekends.   Patient lives at  home with her grandmother [name] and [name].     Education: Patient has 12th grade education.       Employment:  Disability      Education: attends [employer]; M-F 10-4      Tobacco: none      Alcohol: none      Exercise: walking; walks on weekends with caregiver. Physical activity at school; goes to [facility] twice per day.   Right handed.   Caffeine- Couple times a week.- One   Family History  Problem Relation Age of Onset  . Seizures Mother   . Diabetes Father   . Seizures Sister   . Mental retardation Sister        Objective:    BP 99/62 mmHg  Pulse 77  Temp(Src) 98 F (36.7 C) (Oral)  Resp 16  Wt 179 lb 3.2 oz (81.285 kg) Physical Exam  Constitutional: She is oriented to person, place, and time. She appears well-developed and well-nourished. No distress.  HENT:  Head: Normocephalic and atraumatic.  Right Ear: External ear normal. No drainage, swelling or tenderness. Tympanic membrane is not injected, not scarred, not perforated, not erythematous and not retracted.  Left Ear: There is swelling and tenderness. No drainage. Tympanic membrane is not injected, not scarred, not perforated, not erythematous, not retracted and not bulging.  Mouth/Throat: Oropharynx is clear and moist. No oropharyngeal exudate.  Eyes: Conjunctivae are normal. Pupils  are equal, round, and reactive to light.  Neck: Normal range of motion. Neck supple.  Cardiovascular: Normal rate, regular rhythm and normal heart sounds.  Exam reveals no gallop and no friction rub.   No murmur heard. Pulmonary/Chest: Effort normal and breath sounds normal. She has no wheezes. She has no rales.  Lymphadenopathy:    She has no cervical adenopathy.  Neurological: She is alert and oriented to person, place, and time.  Skin: She is not diaphoretic.  Psychiatric: She has a normal mood and affect. Her behavior is normal.  Nursing note and vitals reviewed.       Assessment & Plan:   1. Otitis externa, left     2. Otitis, externa, infective, left     No orders of the defined types were placed in this encounter.   Meds ordered this encounter  Medications  . neomycin-polymyxin-hydrocortisone (CORTISPORIN) otic solution    Sig: Place 4 drops into the left ear 3 (three) times daily. For 10 days.    Dispense:  10 mL    Refill:  0    Return if symptoms worsen or fail to improve.    Athaliah Baumbach Elayne Guerin, M.D. [facility] [address] [place], [place]  [address] [phone] phone [phone] fax

## 2014-12-12 ENCOUNTER — Ambulatory Visit (INDEPENDENT_AMBULATORY_CARE_PROVIDER_SITE_OTHER): Payer: Medicare Other | Admitting: Physician Assistant

## 2014-12-12 VITALS — BP 118/72 | HR 90 | Temp 98.6°F | Resp 14 | Ht 63.0 in | Wt 180.0 lb

## 2014-12-12 DIAGNOSIS — R300 Dysuria: Secondary | ICD-10-CM | POA: Diagnosis not present

## 2014-12-12 DIAGNOSIS — R350 Frequency of micturition: Secondary | ICD-10-CM | POA: Diagnosis not present

## 2014-12-12 DIAGNOSIS — N898 Other specified noninflammatory disorders of vagina: Secondary | ICD-10-CM

## 2014-12-12 DIAGNOSIS — T3695XA Adverse effect of unspecified systemic antibiotic, initial encounter: Secondary | ICD-10-CM

## 2014-12-12 DIAGNOSIS — N3000 Acute cystitis without hematuria: Secondary | ICD-10-CM

## 2014-12-12 DIAGNOSIS — B379 Candidiasis, unspecified: Secondary | ICD-10-CM | POA: Diagnosis not present

## 2014-12-12 LAB — POCT URINALYSIS DIP (MANUAL ENTRY)
BILIRUBIN UA: NEGATIVE
Blood, UA: NEGATIVE
Glucose, UA: NEGATIVE
Ketones, POC UA: NEGATIVE
NITRITE UA: NEGATIVE
PH UA: 7
Protein Ur, POC: NEGATIVE
Spec Grav, UA: 1.015
Urobilinogen, UA: 0.2

## 2014-12-12 LAB — POC MICROSCOPIC URINALYSIS (UMFC): Mucus: ABSENT

## 2014-12-12 LAB — POCT WET + KOH PREP
TRICH BY WET PREP: ABSENT
YEAST BY KOH: ABSENT
Yeast by wet prep: ABSENT

## 2014-12-12 MED ORDER — METRONIDAZOLE 500 MG PO TABS: 500.0000 mg | ORAL_TABLET | Freq: Two times a day (BID) | ORAL | Status: DC

## 2014-12-12 NOTE — Progress Notes (Signed)
[facility] [address] [place] [address] [phone]  Date:  12/12/2014   Name:  Cheryl Dominguez   DOB:  10/28/1990   MRN:  [id number]  PCP:  [name], MD   Chief Complaint  Patient presents with  . FIshy vaginal odor     History of Present Illness:  Cheryl Dominguez is a 24 y.o. female patient who presents to [facility] for cc of vaginal odor for a few days.  She is here with mother.  Mother states that she was told by their day camp, that she has some odd vaginal odor.  She has no itching that she can state.  She denies burning with urination, abdominal pain, fever, or hematuria, or back pain.  She may have noticed urinating more often.    Guardian has concern of starting an antibiotic at this time, as she has had a cyclical round of antibiotics for uti to bv and yeast to uti abx and again.     Patient Active Problem List   Diagnosis Date Noted  . Seasonal allergic rhinitis due to pollen 12/01/2014  . Slow transit constipation 12/01/2014  . Primary ovarian insufficiency 12/01/2014  . Hearing loss 12/01/2014  . UTI (urinary tract infection) 02/22/2014  . Seizures (Pump Back) 06/26/2012  . ADD (attention deficit disorder)     Past Medical History  Diagnosis Date  . ADD (attention deficit disorder)   . Screening for mental disease/developmental disorder   . Hearing loss   . Attention deficit disorder   . ADHD (attention deficit hyperactivity disorder)   . Allergy   . Constipation   . Primary ovarian insufficiency     Past Surgical History  Procedure Laterality Date  . Back surgery      scoliosis; teenage    Social History  Substance Use Topics  . Smoking status: Never Smoker   . Smokeless tobacco: Never Used  . Alcohol Use: No    Family History  Problem Relation Age of Onset  . Seizures Mother   . Diabetes Father   . Seizures Sister   . Mental retardation Sister     No Known Allergies  Medication list has been reviewed and  updated.  Current Outpatient Prescriptions on File Prior to Visit  Medication Sig Dispense Refill  . CALCIUM PO Take 1 capsule by mouth 2 (two) times daily.    [name] Kitchen CRANBERRY EXTRACT PO Take by mouth 2 (two) times daily.    [name] Kitchen lisdexamfetamine (VYVANSE) 40 MG capsule Take 40 mg by mouth every morning.    . loratadine (CLARITIN) 10 MG tablet Take 10 mg by mouth daily.    [name] Kitchen LORazepam (ATIVAN) 1 MG tablet Take 1 mg by mouth daily as needed for anxiety.    . Multiple Vitamins-Minerals (MULTIVITAMIN PO) Take by mouth 2 (two) times daily.    . polyethylene glycol powder (GLYCOLAX/MIRALAX) powder Take 17 g by mouth daily. 3350 g 11  . risperiDONE (RISPERDAL) 0.5 MG tablet Take 0.5 mg by mouth 2 (two) times daily.    [name] Kitchen guanFACINE (INTUNIV) 1 MG TB24 Take by mouth daily.    [name] Kitchen lisdexamfetamine (VYVANSE) 70 MG capsule Take 50 mg by mouth every morning.     . norethindrone-ethinyl estradiol-iron (MICROGESTIN FE,GILDESS FE,LOESTRIN FE) 1.5-30 MG-MCG tablet Take 1 tablet by mouth daily.     No current facility-administered medications on file prior to visit.    ROS ROS otherwise unremarkable unless listed above.   Physical Examination: BP 118/72  mmHg  Pulse 90  Temp(Src) 98.6 F (37 C) (Oral)  Resp 14  Ht 5\' 3"  (1.6 m)  Wt 180 lb (81.647 kg)  BMI 31.89 kg/m2  SpO2 99% Ideal Body Weight: Weight in (lb) to have BMI = 25: 140.8  Physical Exam  Constitutional: She is oriented to person, place, and time. She appears well-developed and well-nourished. No distress.  HENT:  Head: Normocephalic and atraumatic.  Right Ear: External ear normal.  Left Ear: External ear normal.  Eyes: Conjunctivae and EOM are normal. Pupils are equal, round, and reactive to light.  Cardiovascular: Normal rate and regular rhythm.  Exam reveals no gallop and no friction rub.   No murmur heard. Pulmonary/Chest: Effort normal and breath sounds normal. No respiratory distress. She has no wheezes.  Abdominal: Soft. Bowel  sounds are normal. There is no tenderness. There is no guarding, no CVA tenderness, no tenderness at McBurney's point and negative Murphy's sign.  Neurological: She is alert and oriented to person, place, and time.  Skin: She is not diaphoretic.  Psychiatric: She has a normal mood and affect. Her behavior is normal.      Assessment and Plan: Cheryl Dominguez is a 24 y.o. female who is here today  Patient was seen for recurrent uti/bv.  At patient guardian request, we will place a urine culture and will hold off on antibiotic until we can have concrete infectious findings.  Vitals are normal, and pe is unremarkable.  I will start her on the metronidazole at this time and await urince culture.  Dysuria - Plan: POCT urinalysis dipstick, POCT Microscopic Urinalysis ([facility])  Urinary frequency - Plan: Urine culture  Vaginal discharge - Plan: Urine culture, POCT Wet + KOH Prep, metroNIDAZOLE (FLAGYL) 500 MG tablet  Ivar Drape, PA-C [facility] 12/12/2014 12:29 PM  Addendum: placed patient on cipro for 3 days for uncomplicated uti.  Given diflucan for abnormal discharge to develop.  Instruction left on voicemail of guardian, [facility].

## 2014-12-12 NOTE — Patient Instructions (Addendum)
--  take the probiotic supplements --urine culture sent.  I will contact you with the results.  In the meantime, let's take the metronidazole.  Bacterial Vaginosis Bacterial vaginosis is an infection of the vagina. It happens when too many germs (bacteria) grow in the vagina. Having this infection puts you at risk for getting other infections from sex. Treating this infection can help lower your risk for other infections, such as:   Chlamydia.  Gonorrhea.  HIV.  Herpes. HOME CARE  Take your medicine as told by your doctor.  Finish your medicine even if you start to feel better.  Tell your sex partner that you have an infection. They should see their doctor for treatment.  During treatment:  Avoid sex or use condoms correctly.  Do not douche.  Do not drink alcohol unless your doctor tells you it is ok.  Do not breastfeed unless your doctor tells you it is ok. GET HELP IF:  You are not getting better after 3 days of treatment.  You have more grey fluid (discharge) coming from your vagina than before.  You have more pain than before.  You have a fever. MAKE SURE YOU:   Understand these instructions.  Will watch your condition.  Will get help right away if you are not doing well or get worse.   This information is not intended to replace advice given to you by your health care provider. Make sure you discuss any questions you have with your health care provider.   Document Released: 09/28/2007 Document Revised: 01/09/2014 Document Reviewed: 07/31/2012 Elsevier Interactive Patient Education [employer].

## 2014-12-15 LAB — URINE CULTURE

## 2014-12-18 MED ORDER — FLUCONAZOLE 150 MG PO TABS: 150.0000 mg | ORAL_TABLET | Freq: Once | ORAL | Status: DC

## 2014-12-18 MED ORDER — CIPROFLOXACIN HCL 250 MG PO TABS: 250.0000 mg | ORAL_TABLET | Freq: Two times a day (BID) | ORAL | Status: AC

## 2015-01-08 DIAGNOSIS — N910 Primary amenorrhea: Secondary | ICD-10-CM | POA: Diagnosis not present

## 2015-01-17 ENCOUNTER — Ambulatory Visit (INDEPENDENT_AMBULATORY_CARE_PROVIDER_SITE_OTHER): Payer: Medicare Other | Admitting: Family Medicine

## 2015-01-17 VITALS — BP 120/86 | HR 115 | Temp 98.5°F | Resp 20 | Ht 63.78 in | Wt 183.0 lb

## 2015-01-17 DIAGNOSIS — N898 Other specified noninflammatory disorders of vagina: Secondary | ICD-10-CM | POA: Diagnosis not present

## 2015-01-17 DIAGNOSIS — N3000 Acute cystitis without hematuria: Secondary | ICD-10-CM

## 2015-01-17 LAB — POCT URINALYSIS DIP (MANUAL ENTRY)
BILIRUBIN UA: NEGATIVE
Blood, UA: NEGATIVE
GLUCOSE UA: NEGATIVE
Ketones, POC UA: NEGATIVE
NITRITE UA: NEGATIVE
Protein Ur, POC: NEGATIVE
Spec Grav, UA: 1.02
Urobilinogen, UA: 0.2
pH, UA: 6.5

## 2015-01-17 LAB — POC MICROSCOPIC URINALYSIS (UMFC): Mucus: ABSENT

## 2015-01-17 LAB — POCT WET + KOH PREP
Trich by wet prep: ABSENT
YEAST BY KOH: ABSENT
Yeast by wet prep: ABSENT

## 2015-01-17 MED ORDER — METRONIDAZOLE 0.75 % VA GEL: 1.0000 | Freq: Every day | VAGINAL | Status: DC

## 2015-01-17 NOTE — Progress Notes (Signed)
Subjective:  By signing my name below, I, [name], attest that this documentation has been prepared under the direction and in the presence of [name], MD.  Electronically Signed: [name], ED Scribe. 01/17/2015. 10:03 AM.   Patient ID: Cheryl Dominguez, female    DOB: 1990-01-31, 25 y.o.   MRN: [id number]  HPI Chief Complaint  Patient presents with  . Urinary Tract Infection   HPI Comments: Cheryl Dominguez is a 25 y.o. Female brought in by her health care POA who presents to the [facility] complaining of recurrent vaginal odor with associated dysuria and clumpy yellow vaginal discharge. Pt was seen here 12/10 for the same complaint and was treated flagyl, which she has done well with in the past. She has had multiple UTI's in the past and has been seen by a urologist as well as being followed closely by a gynecologist.  She is not sexually active. She does not menstruate due to having primary ovarian sufficiency but her gynecologist as started her on OCPs to see if inducing normal menses might help some of the recurrent vaginal discharge issues - just started them yesterday.  She denies vaginal itching, back pain, abnormal BM.  She does not use any cleaning procedures (e.g. Douching) or products other than just cleaning externally with water while in the shower as instructed by her physicians.   Per 06/26/12 neurology note by Dr. Krista Blue. "She was born with dysmorphic features, her mother had epilepsy, was taking valproic acid during pregnancy, she was developmentally delayed, delayed in motor skill, language, cognitive development.  Called "valproic acid syndrome".  Pt's sister also suffered similar, but more severe disability. Pt did attend school and enjoys reading and putting puzzles together."  Past Medical History  Diagnosis Date  . ADD (attention deficit disorder)   . Screening for mental disease/developmental disorder   . Hearing loss   . Attention deficit disorder    . ADHD (attention deficit hyperactivity disorder)   . Allergy   . Constipation   . Primary ovarian insufficiency    Past Surgical History  Procedure Laterality Date  . Back surgery      scoliosis; teenage   Prior to Admission medications   Medication Sig Start Date End Date Taking? Authorizing Provider  CALCIUM PO Take 1 capsule by mouth 2 (two) times daily.   Yes Historical Provider, MD  CRANBERRY EXTRACT PO Take by mouth 2 (two) times daily.   Yes Historical Provider, MD  guanFACINE (INTUNIV) 1 MG TB24 Take by mouth daily.   Yes Historical Provider, MD  lisdexamfetamine (VYVANSE) 70 MG capsule Take 50 mg by mouth every morning.    Yes Historical Provider, MD  loratadine (CLARITIN) 10 MG tablet Take 10 mg by mouth daily.   Yes Historical Provider, MD  LORazepam (ATIVAN) 1 MG tablet Take 1 mg by mouth daily as needed for anxiety.   Yes Historical Provider, MD  Multiple Vitamins-Minerals (MULTIVITAMIN PO) Take by mouth 2 (two) times daily.   Yes Historical Provider, MD  norethindrone-ethinyl estradiol-iron (MICROGESTIN FE,GILDESS FE,LOESTRIN FE) 1.5-30 MG-MCG tablet Take 1 tablet by mouth daily.   Yes Historical Provider, MD  polyethylene glycol powder (GLYCOLAX/MIRALAX) powder Take 17 g by mouth daily. 11/06/14  Yes Wardell Honour, MD  risperiDONE (RISPERDAL) 0.5 MG tablet Take 0.5 mg by mouth 2 (two) times daily.   Yes Historical Provider, MD  fluconazole (DIFLUCAN) 150 MG tablet Take 1 tablet (150 mg total) by mouth once.  Repeat if needed Patient not taking: Reported on 01/17/2015 12/18/14   Dorian Heckle English, PA  lisdexamfetamine (VYVANSE) 40 MG capsule Take 40 mg by mouth every morning. Reported on 01/17/2015    Historical Provider, MD  metroNIDAZOLE (FLAGYL) 500 MG tablet Take 1 tablet (500 mg total) by mouth 2 (two) times daily with a meal. DO NOT CONSUME ALCOHOL WHILE TAKING THIS MEDICATION. Patient not taking: Reported on 01/17/2015 12/12/14   Dorian Heckle English, PA   Review of  Systems  Constitutional: Negative for fever and chills.  Gastrointestinal: Negative for nausea and abdominal pain.  Genitourinary: Positive for dysuria and vaginal discharge.       Vaginal odor  Musculoskeletal: Negative for myalgias and back pain.  Allergic/Immunologic: Positive for environmental allergies.    Objective:   Physical Exam  Constitutional: She is oriented to person, place, and time. She appears well-developed and well-nourished. No distress.  HENT:  Head: Normocephalic and atraumatic.  Eyes: Conjunctivae and EOM are normal.  Neck: Neck supple.  Cardiovascular: Normal rate.   Pulmonary/Chest: Effort normal.  Musculoskeletal: Normal range of motion.  Neurological: She is alert and oriented to person, place, and time.  Skin: Skin is warm and dry.  Psychiatric: She has a normal mood and affect. Her behavior is normal.  Nursing note and vitals reviewed.  Filed Vitals:   01/17/15 0954  BP: 120/86  Pulse: 115  Temp: 98.5 F (36.9 C)  TempSrc: Oral  Resp: 20  Height: 5' 3.78" (1.62 m)  Weight: 183 lb (83.008 kg)  SpO2: 98%    Results for orders placed or performed in visit on 01/17/15  POCT urinalysis dipstick  Result Value Ref Range   Color, UA yellow yellow   Clarity, UA cloudy (A) clear   Glucose, UA negative negative   Bilirubin, UA negative negative   Ketones, POC UA negative negative   Spec Grav, UA 1.020    Blood, UA negative negative   pH, UA 6.5    Protein Ur, POC negative negative   Urobilinogen, UA 0.2    Nitrite, UA Negative Negative   Leukocytes, UA Trace (A) Negative  POCT Microscopic Urinalysis ([facility])  Result Value Ref Range   WBC,UR,HPF,POC Few (A) None WBC/hpf   RBC,UR,HPF,POC None None RBC/hpf   Bacteria None None, Too numerous to count   Mucus Absent Absent   Epithelial Cells, UR Per Microscopy Few (A) None, Too numerous to count cells/hpf  POCT Wet + KOH Prep  Result Value Ref Range   Yeast by KOH Absent Present, Absent   Yeast by  wet prep Absent Present, Absent   WBC by wet prep None None, Few, Too numerous to count   Clue Cells Wet Prep HPF POC None None, Too numerous to count   Trich by wet prep Absent Present, Absent   Bacteria Wet Prep HPF POC Few None, Few, Too numerous to count   Epithelial Cells By [employer] Pref ([facility]) Few None, Few, Too numerous to count   RBC,UR,HPF,POC None None RBC/hpf   Assessment & Plan:   1. Acute cystitis without hematuria   2. Vaginal discharge   pt self-collected wet prep with the aid of her HCPOA today but is unrevealing. Due to recurrent sxs, rec trial of vag metrogel. Advised that if sxs went away quickly would be fine to not complete full course. Cont oral probiotic. Await UClx.  Orders Placed This Encounter  Procedures  . Urine culture  . POCT urinalysis dipstick  . POCT Microscopic  Urinalysis ([facility])  . POCT Wet + KOH Prep    Meds ordered this encounter  Medications  . metroNIDAZOLE (METROGEL VAGINAL) 0.75 % vaginal gel    Sig: Place 1 Applicatorful vaginally at bedtime. For 5 days    Dispense:  70 g    Refill:  0

## 2015-01-17 NOTE — Patient Instructions (Signed)
Metronidazole vaginal gel What is this medicine? METRONIDAZOLE (me troe NI da zole) VAGINAL GEL is an antiinfective. It is used to treat bacterial vaginitis. This medicine may be used for other purposes; ask your health care provider or pharmacist if you have questions. What should I tell my health care provider before I take this medicine? They need to know if you have any of these conditions: -if you drink alcohol containing drinks -if you have taken disulfiram in the past two weeks -liver disease -peripheral neuropathy -seizures -an unusual or allergic reaction to metronidazole, parabens, nitroimidazoles, or other medicines, foods, dyes, or preservatives -pregnant or trying to get pregnant -breast-feeding How should I use this medicine? This medicine is only for use in the vagina. Do not take by mouth or apply to other areas of the body. Follow the directions on the prescription label. Wash hands before and after use. Screw the applicator to the tube and squeeze the tube gently to fill the applicator. Lie on your back, part and bend your knees. Insert the applicator tip high in the vagina and push the plunger to release the gel into the vagina. Gently remove the applicator. Wash the applicator well with warm water and soap. Use at regular intervals. Finish the full course prescribed by your doctor or health care professional even if you think your condition is better. Do not stop using except on the advice of your doctor or health care professional. Talk to your pediatrician regarding the use of this medicine in children. Special care may be needed. Overdosage: If you think you have taken too much of this medicine contact a poison control center or emergency room at once. NOTE: This medicine is only for you. Do not share this medicine with others. What if I miss a dose? If you miss a dose, use it as soon as you can. If it is almost time for your next dose, use only that dose. Do not use double  or extra doses. What may interact with this medicine? Do not take this medicine with any of the following medications: -alcohol or any product that contains alcohol -cisapride -dofetilide -dronedarone -pimozide -thioridazine -ziprasidone This medicine may also interact with the following medications: -cimetidine -lithium -other medicines that prolong the QT interval (cause an abnormal heart rhythm) -warfarin This list may not describe all possible interactions. Give your health care provider a list of all the medicines, herbs, non-prescription drugs, or dietary supplements you use. Also tell them if you smoke, drink alcohol, or use illegal drugs. Some items may interact with your medicine. What should I watch for while using this medicine? Tell your doctor or health care professional if your symptoms do not start to get better in 2 or 3 days. Avoid alcoholic drinks while you are taking this medicine and for three days afterwards. Alcohol may make you feel dizzy, sick, or flushed. You may get drowsy or dizzy. Do not drive, use machinery, or do anything that needs mental alertness until you know how this medicine affects you. To reduce the risk of dizzy or fainting spells, do not sit or stand up quickly, especially if you are an older patient. Your clothing may get soiled if you have a vaginal discharge. You can wear a sanitary napkin. Do not use tampons. Wear freshly washed cotton, not synthetic, panties. Do not have sex until you have finished your treatment. Having sex can make the treatment less effective. Your sexual partner may also need treatment. What side effects may I   notice from receiving this medicine? Side effects that you should report to your doctor or health care professional as soon as possible: -dizziness -frequent passing of urine -headache -loss of appetite -nausea -skin rash, itching -stomach pain or cramps -vaginal irritation or discharge -vulvar burning or  swelling Side effects that usually do not require medical attention (report to your doctor or health care professional if they continue or are bothersome): -dark urine -mild vaginal burning This list may not describe all possible side effects. Call your doctor for medical advice about side effects. You may report side effects to FDA at 1-800-FDA-1088. Where should I keep my medicine? Keep out of the reach of children. Store at room temperature between 15 and 30 degrees C (59 and 86 degrees F). Do not freeze. Throw away any unused medicine after the expiration date. NOTE: This sheet is a summary. It may not cover all possible information. If you have questions about this medicine, talk to your doctor, pharmacist, or health care provider.    2016, Elsevier/Gold Standard. (2012-07-26 14:09:23)  

## 2015-01-18 LAB — URINE CULTURE

## 2015-01-18 NOTE — Addendum Note (Signed)
Addended by: [name] on: 01/18/2015 12:57 PM   Modules accepted: Orders, SmartSet

## 2015-01-24 ENCOUNTER — Other Ambulatory Visit (INDEPENDENT_AMBULATORY_CARE_PROVIDER_SITE_OTHER): Payer: Medicare Other

## 2015-01-24 ENCOUNTER — Encounter: Payer: Self-pay | Admitting: Family Medicine

## 2015-01-24 DIAGNOSIS — N3000 Acute cystitis without hematuria: Secondary | ICD-10-CM

## 2015-01-24 DIAGNOSIS — N898 Other specified noninflammatory disorders of vagina: Secondary | ICD-10-CM

## 2015-01-24 LAB — POCT URINALYSIS DIP (MANUAL ENTRY)
BILIRUBIN UA: NEGATIVE
Bilirubin, UA: NEGATIVE
Glucose, UA: NEGATIVE
NITRITE UA: NEGATIVE
PH UA: 7
PROTEIN UA: NEGATIVE
RBC UA: NEGATIVE
SPEC GRAV UA: 1.015
Urobilinogen, UA: 0.2

## 2015-01-24 LAB — POC MICROSCOPIC URINALYSIS (UMFC): Mucus: ABSENT

## 2015-01-26 LAB — URINE CULTURE

## 2015-01-28 MED ORDER — NITROFURANTOIN MONOHYD MACRO 100 MG PO CAPS: 100.0000 mg | ORAL_CAPSULE | Freq: Two times a day (BID) | ORAL | Status: DC

## 2015-03-03 DIAGNOSIS — F901 Attention-deficit hyperactivity disorder, predominantly hyperactive type: Secondary | ICD-10-CM | POA: Diagnosis not present

## 2015-03-03 DIAGNOSIS — F200 Paranoid schizophrenia: Secondary | ICD-10-CM | POA: Diagnosis not present

## 2015-03-03 DIAGNOSIS — F70 Mild intellectual disabilities: Secondary | ICD-10-CM | POA: Diagnosis not present

## 2015-03-29 DIAGNOSIS — E221 Hyperprolactinemia: Secondary | ICD-10-CM | POA: Diagnosis not present

## 2015-03-29 DIAGNOSIS — N910 Primary amenorrhea: Secondary | ICD-10-CM | POA: Diagnosis not present

## 2015-03-29 DIAGNOSIS — Q8789 Other specified congenital malformation syndromes, not elsewhere classified: Secondary | ICD-10-CM | POA: Diagnosis not present

## 2015-05-26 DIAGNOSIS — F901 Attention-deficit hyperactivity disorder, predominantly hyperactive type: Secondary | ICD-10-CM | POA: Diagnosis not present

## 2015-05-26 DIAGNOSIS — F209 Schizophrenia, unspecified: Secondary | ICD-10-CM | POA: Diagnosis not present

## 2015-05-26 DIAGNOSIS — F70 Mild intellectual disabilities: Secondary | ICD-10-CM | POA: Diagnosis not present

## 2015-06-02 ENCOUNTER — Telehealth: Payer: Self-pay

## 2015-06-02 NOTE — Telephone Encounter (Signed)
Pt is needing a referral to [facility] fax number [phone]   Best number [phone]

## 2015-06-03 ENCOUNTER — Encounter: Payer: Self-pay | Admitting: Family Medicine

## 2015-06-03 DIAGNOSIS — H9193 Unspecified hearing loss, bilateral: Secondary | ICD-10-CM

## 2015-06-03 NOTE — Telephone Encounter (Signed)
Email sent to Dr. Tamala Julian already.

## 2015-06-15 ENCOUNTER — Encounter: Payer: Self-pay | Admitting: Family Medicine

## 2015-07-13 DIAGNOSIS — F70 Mild intellectual disabilities: Secondary | ICD-10-CM | POA: Diagnosis not present

## 2015-07-13 DIAGNOSIS — F209 Schizophrenia, unspecified: Secondary | ICD-10-CM | POA: Diagnosis not present

## 2015-07-13 DIAGNOSIS — F901 Attention-deficit hyperactivity disorder, predominantly hyperactive type: Secondary | ICD-10-CM | POA: Diagnosis not present

## 2015-09-01 ENCOUNTER — Encounter: Payer: Self-pay | Admitting: Family Medicine

## 2015-09-01 ENCOUNTER — Ambulatory Visit (INDEPENDENT_AMBULATORY_CARE_PROVIDER_SITE_OTHER): Payer: Medicare Other | Admitting: Family Medicine

## 2015-09-01 VITALS — BP 114/70 | HR 83 | Temp 98.6°F | Resp 18 | Ht 63.78 in | Wt 188.8 lb

## 2015-09-01 DIAGNOSIS — R569 Unspecified convulsions: Secondary | ICD-10-CM | POA: Diagnosis not present

## 2015-09-01 DIAGNOSIS — A499 Bacterial infection, unspecified: Secondary | ICD-10-CM | POA: Diagnosis not present

## 2015-09-01 DIAGNOSIS — J301 Allergic rhinitis due to pollen: Secondary | ICD-10-CM

## 2015-09-01 DIAGNOSIS — N760 Acute vaginitis: Secondary | ICD-10-CM | POA: Diagnosis not present

## 2015-09-01 DIAGNOSIS — H9193 Unspecified hearing loss, bilateral: Secondary | ICD-10-CM | POA: Diagnosis not present

## 2015-09-01 DIAGNOSIS — Z23 Encounter for immunization: Secondary | ICD-10-CM

## 2015-09-01 DIAGNOSIS — E2839 Other primary ovarian failure: Secondary | ICD-10-CM | POA: Diagnosis not present

## 2015-09-01 DIAGNOSIS — F988 Other specified behavioral and emotional disorders with onset usually occurring in childhood and adolescence: Secondary | ICD-10-CM

## 2015-09-01 DIAGNOSIS — K5901 Slow transit constipation: Secondary | ICD-10-CM

## 2015-09-01 DIAGNOSIS — F909 Attention-deficit hyperactivity disorder, unspecified type: Secondary | ICD-10-CM

## 2015-09-01 DIAGNOSIS — B9689 Other specified bacterial agents as the cause of diseases classified elsewhere: Secondary | ICD-10-CM

## 2015-09-01 MED ORDER — METRONIDAZOLE 0.75 % VA GEL: 1.0000 | Freq: Every day | VAGINAL | 0 refills | Status: DC

## 2015-09-01 NOTE — Patient Instructions (Signed)
     IF you received an x-ray today, you will receive an invoice from [facility]. Please contact [facility] at [phone] with questions or concerns regarding your invoice.   IF you received labwork today, you will receive an invoice from [facility]. Please contact [facility] at [phone] with questions or concerns regarding your invoice.   Our billing staff will not be able to assist you with questions regarding bills from these companies.  You will be contacted with the lab results as soon as they are available. The fastest way to get your results is to activate your My Chart account. Instructions are located on the last page of this paperwork. If you have not heard from us regarding the results in 2 weeks, please contact this office.      

## 2015-09-01 NOTE — Progress Notes (Signed)
Patient ID: Cheryl Dominguez, female   DOB: 1990/06/23, 25 y.o.   MRN: [id number]   Subjective:  By signing my name below, I, Cheryl Dominguez, attest that this documentation has been prepared under the direction and in the presence of [name], MD. Electronically Signed: Moises Dominguez, [place]. 09/01/2015 , 11:23 AM .  Patient was seen in Room 4 .   Patient ID: Cheryl Dominguez, female    DOB: Jan 20, 1990, 25 y.o.   MRN: [id number]  09/01/2015  Follow-up (for both ears)   HPI Cheryl Dominguez is a 25 y.o. female who presents to [facility] here for follow up for her ears. She reports her ears have been itching last night and wants to have it checked. She denies scaly skin from her ears. She's also planning on going to the beach next week with friends. She's going from Tuesday to Friday. Last year, when she went to the beach, she had swimmer's ear. Her caretaker asked the audiologist and was suggested to use ear wash as a preventative. She also plans to leave her hearing aids at home.   She also uses metrogel previously prescribed by Dr. Brigitte Pulse for vaginal odor. She is also followed by Dr. Delsa Sale at [facility] for OBGYN. She's been on hormones consistently since January.   She takes claritin for allergies. She is still taking miralax for good relief.   She was recommended flu shot today, and will receive one today.   Her grandmother's birthday was yesterday, who turned 74. They had chocolate cake.   Review of Systems  Constitutional: Negative for chills, diaphoresis, fatigue, fever and unexpected weight change.  HENT: Negative for ear discharge and ear pain.   Eyes: Negative for visual disturbance.  Respiratory: Negative for cough and shortness of breath.   Cardiovascular: Negative for chest pain, palpitations and leg swelling.  Gastrointestinal: Negative for abdominal pain, constipation, diarrhea, nausea and vomiting.  Endocrine: Negative for cold intolerance, heat intolerance,  polydipsia, polyphagia and polyuria.  Skin: Negative for rash and wound.  Neurological: Negative for dizziness, tremors, seizures, syncope, facial asymmetry, speech difficulty, weakness, light-headedness, numbness and headaches.    Past Medical History:  Diagnosis Date  . ADD (attention deficit disorder)   . ADHD (attention deficit hyperactivity disorder)   . Allergy   . Attention deficit disorder   . Congenital abnormalities   . Constipation   . 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 Hearing loss   . Primary ovarian insufficiency   . Screening for mental disease/developmental disorder    Past Surgical History:  Procedure Laterality Date  . BACK SURGERY     scoliosis; teenage   No Known Allergies Current Outpatient Prescriptions  Medication Sig Dispense Refill  . CALCIUM PO Take 1 capsule by mouth 2 (two) times daily.    [name] Kitchen CRANBERRY EXTRACT PO Take by mouth 2 (two) times daily.    [name] Kitchen escitalopram (LEXAPRO) 10 MG tablet Take 10 mg by mouth at bedtime.    [name] Kitchen guanFACINE (INTUNIV) 1 MG TB24 Take by mouth daily.    [name] Kitchen lisdexamfetamine (VYVANSE) 40 MG capsule Take 40 mg by mouth every morning. Reported on 01/17/2015    . loratadine (CLARITIN) 10 MG tablet Take 10 mg by mouth daily.    [name] Kitchen LORazepam (ATIVAN) 1 MG tablet Take 1 mg by mouth daily as needed for anxiety.    . Multiple Vitamins-Minerals (MULTIVITAMIN PO) Take  by mouth 2 (two) times daily.    . norethindrone-ethinyl estradiol-iron (MICROGESTIN FE,GILDESS FE,LOESTRIN FE) 1.5-30 MG-MCG tablet Take 1 tablet by mouth daily.    . polyethylene glycol powder (GLYCOLAX/MIRALAX) powder Take 17 g by mouth daily. 3350 g 11  . risperiDONE (RISPERDAL) 0.5 MG tablet Take 0.5 mg by mouth 2 (two) times daily.    [name] Kitchen lisdexamfetamine (VYVANSE) 70 MG capsule Take 50 mg by mouth every morning.     . metroNIDAZOLE (METROGEL VAGINAL) 0.75 % vaginal gel Place 1  Applicatorful vaginally at bedtime. For 5 days 70 g 0   No current facility-administered medications for this visit.    Social History   Social History  . Marital status: Single    Spouse name: N/A  . Number of children: 0  . Years of education: 12   Occupational History  .      works at [employer]   Social History Main Topics  . Smoking status: Never Smoker  . Smokeless tobacco: Never Used  . Alcohol use No  . Drug use: No  . Sexual activity: No   Other Topics Concern  . Not on file   Social History Narrative   Marital status: single; single; not dating  Equities trader; spends weekends.   Patient lives at home with her grandmother [name] and [name].     Education: Patient has 12th grade education.       Employment:  Disability      Education: attends [employer]; M-F 10-4      Tobacco: none      Alcohol: none      Exercise: walking; walks on weekends with caregiver. Physical activity at school; goes to [facility] twice per day.   Right handed.   Caffeine- Couple times a week.- One   Family History  Problem Relation Age of Onset  . Seizures Mother   . Diabetes Father   . Seizures Sister   . Mental retardation Sister        Objective:    BP 114/70   Pulse 83   Temp 98.6 F (37 C) (Oral)   Resp 18   Ht 5' 3.78" (1.62 m)   Wt 188 lb 12.8 oz (85.6 kg)   LMP 08/08/2015   SpO2 99%   BMI 32.63 kg/m   Physical Exam  Constitutional: She is oriented to person, place, and time. She appears well-developed and well-nourished. No distress.  HENT:  Head: Normocephalic and atraumatic.  Right Ear: External ear normal.  Left Ear: External ear normal.  Nose: Nose normal.  Mouth/Throat: Oropharynx is clear and moist.  Eyes: Conjunctivae and EOM are normal. Pupils are equal, round, and reactive to light.  Neck: Normal range of motion. Neck supple. Carotid bruit is not present. No thyromegaly present.  Cardiovascular: Normal rate, regular rhythm, normal  heart sounds and intact distal pulses.  Exam reveals no gallop and no friction rub.   No murmur heard. Pulmonary/Chest: Effort normal and breath sounds normal. No respiratory distress. She has no wheezes. She has no rales.  Abdominal: Soft. Bowel sounds are normal. She exhibits no distension and no mass. There is no tenderness. There is no rebound and no guarding.  Musculoskeletal: Normal range of motion.  Lymphadenopathy:    She has no cervical adenopathy.  Neurological: She is alert and oriented to person, place, and time. No cranial nerve deficit.  Skin: Skin is warm and dry. No rash noted. She is not diaphoretic. No erythema. No pallor.  Psychiatric: She has a normal mood and affect. Her behavior is normal.  Nursing note and vitals reviewed.       Assessment & Plan:   1. Seasonal allergic rhinitis due to pollen   2. Slow transit constipation   3. Seizures ([place])   4. ADD (attention deficit disorder)   5. Hearing loss, bilateral   6. Primary ovarian insufficiency   7. Need for prophylactic vaccination and inoculation against influenza   8. Bacterial vaginitis     Orders Placed This Encounter  Procedures  . Flu Vaccine QUAD 36+ mos IM   Meds ordered this encounter  Medications  . escitalopram (LEXAPRO) 10 MG tablet    Sig: Take 10 mg by mouth at bedtime.  . metroNIDAZOLE (METROGEL VAGINAL) 0.75 % vaginal gel    Sig: Place 1 Applicatorful vaginally at bedtime. For 5 days    Dispense:  70 g    Refill:  0    Return in about 3 months (around 12/02/2015) for complete physical examiniation.   I personally performed the services described in this documentation, which was scribed in my presence. The recorded information has been reviewed and considered.  Lyvia Mondesir Elayne Guerin, M.D. [facility] [address] [place], [place]  [address] [phone] phone [phone] fax

## 2015-10-06 DIAGNOSIS — F70 Mild intellectual disabilities: Secondary | ICD-10-CM | POA: Diagnosis not present

## 2015-10-06 DIAGNOSIS — F901 Attention-deficit hyperactivity disorder, predominantly hyperactive type: Secondary | ICD-10-CM | POA: Diagnosis not present

## 2015-10-06 DIAGNOSIS — F209 Schizophrenia, unspecified: Secondary | ICD-10-CM | POA: Diagnosis not present

## 2015-10-21 ENCOUNTER — Encounter: Payer: Self-pay | Admitting: Family Medicine

## 2015-11-23 ENCOUNTER — Encounter: Payer: Self-pay | Admitting: Family Medicine

## 2015-11-23 ENCOUNTER — Ambulatory Visit (INDEPENDENT_AMBULATORY_CARE_PROVIDER_SITE_OTHER): Payer: Medicare Other | Admitting: Family Medicine

## 2015-11-23 VITALS — BP 116/76 | HR 76 | Temp 98.5°F | Resp 18 | Wt 189.0 lb

## 2015-11-23 DIAGNOSIS — K5901 Slow transit constipation: Secondary | ICD-10-CM | POA: Diagnosis not present

## 2015-11-23 DIAGNOSIS — Z6832 Body mass index (BMI) 32.0-32.9, adult: Secondary | ICD-10-CM | POA: Diagnosis not present

## 2015-11-23 DIAGNOSIS — E6609 Other obesity due to excess calories: Secondary | ICD-10-CM

## 2015-11-23 DIAGNOSIS — F902 Attention-deficit hyperactivity disorder, combined type: Secondary | ICD-10-CM

## 2015-11-23 DIAGNOSIS — J301 Allergic rhinitis due to pollen: Secondary | ICD-10-CM

## 2015-11-23 DIAGNOSIS — E2839 Other primary ovarian failure: Secondary | ICD-10-CM

## 2015-11-23 DIAGNOSIS — H900 Conductive hearing loss, bilateral: Secondary | ICD-10-CM | POA: Diagnosis not present

## 2015-11-23 DIAGNOSIS — E7800 Pure hypercholesterolemia, unspecified: Secondary | ICD-10-CM | POA: Diagnosis not present

## 2015-11-23 DIAGNOSIS — Z131 Encounter for screening for diabetes mellitus: Secondary | ICD-10-CM | POA: Diagnosis not present

## 2015-11-23 DIAGNOSIS — R569 Unspecified convulsions: Secondary | ICD-10-CM | POA: Diagnosis not present

## 2015-11-23 DIAGNOSIS — Z114 Encounter for screening for human immunodeficiency virus [HIV]: Secondary | ICD-10-CM | POA: Diagnosis not present

## 2015-11-23 DIAGNOSIS — Z13220 Encounter for screening for lipoid disorders: Secondary | ICD-10-CM | POA: Diagnosis not present

## 2015-11-23 DIAGNOSIS — Z0000 Encounter for general adult medical examination without abnormal findings: Secondary | ICD-10-CM | POA: Diagnosis not present

## 2015-11-23 LAB — CBC WITH DIFFERENTIAL/PLATELET
BASOS PCT: 0 %
Basophils Absolute: 0 cells/uL (ref 0–200)
EOS PCT: 0 %
Eosinophils Absolute: 0 cells/uL — ABNORMAL LOW (ref 15–500)
HCT: 42 % (ref 35.0–45.0)
HEMOGLOBIN: 13.7 g/dL (ref 11.7–15.5)
LYMPHS ABS: 3240 {cells}/uL (ref 850–3900)
Lymphocytes Relative: 36 %
MCH: 31.1 pg (ref 27.0–33.0)
MCHC: 32.6 g/dL (ref 32.0–36.0)
MCV: 95.5 fL (ref 80.0–100.0)
MONOS PCT: 5 %
MPV: 10.7 fL (ref 7.5–12.5)
Monocytes Absolute: 450 cells/uL (ref 200–950)
NEUTROS ABS: 5310 {cells}/uL (ref 1500–7800)
Neutrophils Relative %: 59 %
PLATELETS: 304 10*3/uL (ref 140–400)
RBC: 4.4 MIL/uL (ref 3.80–5.10)
RDW: 12.7 % (ref 11.0–15.0)
WBC: 9 10*3/uL (ref 3.8–10.8)

## 2015-11-23 LAB — COMPREHENSIVE METABOLIC PANEL
ALT: 26 U/L (ref 6–29)
AST: 18 U/L (ref 10–30)
Albumin: 3.9 g/dL (ref 3.6–5.1)
Alkaline Phosphatase: 51 U/L (ref 33–115)
BILIRUBIN TOTAL: 0.3 mg/dL (ref 0.2–1.2)
BUN: 8 mg/dL (ref 7–25)
CHLORIDE: 102 mmol/L (ref 98–110)
CO2: 28 mmol/L (ref 20–31)
Calcium: 9.2 mg/dL (ref 8.6–10.2)
Creat: 0.63 mg/dL (ref 0.50–1.10)
Glucose, Bld: 88 mg/dL (ref 65–99)
Potassium: 4.1 mmol/L (ref 3.5–5.3)
SODIUM: 138 mmol/L (ref 135–146)
Total Protein: 6.9 g/dL (ref 6.1–8.1)

## 2015-11-23 LAB — LIPID PANEL
CHOL/HDL RATIO: 2.7 ratio (ref <5.0)
Cholesterol: 110 mg/dL (ref <200)
HDL: 41 mg/dL — ABNORMAL LOW (ref >50)
LDL Cholesterol: 49 mg/dL (ref <100)
Triglycerides: 102 mg/dL (ref <150)
VLDL: 20 mg/dL (ref <30)

## 2015-11-23 LAB — HIV ANTIBODY (ROUTINE TESTING W REFLEX): HIV 1&2 Ab, 4th Generation: NONREACTIVE

## 2015-11-23 MED ORDER — POLYETHYLENE GLYCOL 3350 17 GM/SCOOP PO POWD: 17.0000 g | Freq: Every day | ORAL | 11 refills | Status: DC

## 2015-11-23 NOTE — Patient Instructions (Signed)
     IF you received an x-ray today, you will receive an invoice from [facility]. Please contact [facility] at [phone] with questions or concerns regarding your invoice.   IF you received labwork today, you will receive an invoice from [facility]. Please contact [facility] at [phone] with questions or concerns regarding your invoice.   Our billing staff will not be able to assist you with questions regarding bills from these companies.  You will be contacted with the lab results as soon as they are available. The fastest way to get your results is to activate your My Chart account. Instructions are located on the last page of this paperwork. If you have not heard from us regarding the results in 2 weeks, please contact this office.      

## 2015-11-23 NOTE — Progress Notes (Signed)
Subjective:    Patient ID: Cheryl Dominguez, female    DOB: June 03, 1990, 25 y.o.   MRN: [id number]  11/23/2015  Medication Refill (med check/general check up)   HPI This 25 y.o. female presents for Annual Wellness Examination.  Last physical: unsure; blood work two years ago. Pap smear:04/15/2010 Delsa Sale Eye exam:  +glasses; two year rotation; summer 2018 Dental exam:  09/2015  Immunization History  Administered Date(s) Administered  . HPV Quadrivalent 03/27/2011  . Influenza,inj,Quad PF,36+ Mos 11/06/2014, 09/01/2015  . Tdap 01/25/2011   Wt Readings from Last 3 Encounters:  11/23/15 189 lb (85.7 kg)  09/01/15 188 lb 12.8 oz (85.6 kg)  01/17/15 183 lb (83 kg)   BP Readings from Last 3 Encounters:  11/23/15 116/76  09/01/15 114/70  01/17/15 120/86    Review of Systems  Constitutional: Negative for activity change, appetite change, chills, diaphoresis, fatigue, fever and unexpected weight change.  HENT: Negative for congestion, dental problem, drooling, ear discharge, ear pain, facial swelling, hearing loss, mouth sores, nosebleeds, postnasal drip, rhinorrhea, sinus pressure, sneezing, sore throat, tinnitus, trouble swallowing and voice change.   Eyes: Negative for photophobia, pain, discharge, redness, itching and visual disturbance.  Respiratory: Negative for apnea, cough, choking, chest tightness, shortness of breath, wheezing and stridor.   Cardiovascular: Negative for chest pain, palpitations and leg swelling.  Gastrointestinal: Negative for abdominal distention, abdominal pain, anal bleeding, blood in stool, constipation, diarrhea, nausea, rectal pain and vomiting.  Endocrine: Negative for cold intolerance, heat intolerance, polydipsia, polyphagia and polyuria.  Genitourinary: Negative for decreased urine volume, difficulty urinating, dyspareunia, dysuria, enuresis, flank pain, frequency, genital sores, hematuria, menstrual problem, pelvic pain, urgency, vaginal  bleeding, vaginal discharge and vaginal pain.  Musculoskeletal: Negative for arthralgias, back pain, gait problem, joint swelling, myalgias, neck pain and neck stiffness.  Skin: Negative for color change, pallor, rash and wound.  Allergic/Immunologic: Negative for environmental allergies, food allergies and immunocompromised state.  Neurological: Negative for dizziness, tremors, seizures, syncope, facial asymmetry, speech difficulty, weakness, light-headedness, numbness and headaches.  Hematological: Negative for adenopathy. Does not bruise/bleed easily.  Psychiatric/Behavioral: Negative for agitation, behavioral problems, confusion, decreased concentration, dysphoric mood, hallucinations, self-injury, sleep disturbance and suicidal ideas. The patient is not nervous/anxious and is not hyperactive.        Bedtime 8:30pm; wakes up at 7:00am.      Past Medical History:  Diagnosis Date  . ADD (attention deficit disorder)   . ADHD (attention deficit hyperactivity disorder)   . Allergy   . Attention deficit disorder   . Congenital abnormalities   . Constipation   . 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 Hearing loss   . Primary ovarian insufficiency   . Screening for mental disease/developmental disorder    Past Surgical History:  Procedure Laterality Date  . BACK SURGERY     scoliosis; teenage   No Known Allergies Current Outpatient Prescriptions  Medication Sig Dispense Refill  . CALCIUM PO Take 1 capsule by mouth 2 (two) times daily.    [name] Kitchen CRANBERRY EXTRACT PO Take by mouth 2 (two) times daily.    [name] Kitchen escitalopram (LEXAPRO) 10 MG tablet Take 10 mg by mouth at bedtime.    [name] Kitchen guanFACINE (INTUNIV) 1 MG TB24 Take by mouth daily.    [name] Kitchen lisdexamfetamine (VYVANSE) 40 MG capsule Take 40 mg by mouth every morning. Reported on 01/17/2015    . loratadine (CLARITIN) 10 MG  tablet Take 10 mg by mouth daily.    [name] Kitchen  LORazepam (ATIVAN) 1 MG tablet Take 1 mg by mouth daily as needed for anxiety.    . Multiple Vitamins-Minerals (MULTIVITAMIN PO) Take by mouth 2 (two) times daily.    . norethindrone-ethinyl estradiol-iron (MICROGESTIN FE,GILDESS FE,LOESTRIN FE) 1.5-30 MG-MCG tablet Take 1 tablet by mouth daily.    . polyethylene glycol powder (GLYCOLAX/MIRALAX) powder Take 17 g by mouth daily. 3350 g 11  . risperiDONE (RISPERDAL) 0.5 MG tablet Take 0.5 mg by mouth 2 (two) times daily.    [name] Kitchen lisdexamfetamine (VYVANSE) 70 MG capsule Take 50 mg by mouth every morning.     . metroNIDAZOLE (METROGEL VAGINAL) 0.75 % vaginal gel Place 1 Applicatorful vaginally at bedtime. For 5 days (Patient not taking: Reported on 11/23/2015) 70 g 0   No current facility-administered medications for this visit.    Social History   Social History  . Marital status: Single    Spouse name: N/A  . Number of children: 0  . Years of education: 12   Occupational History  . disability     works at [employer]   Social History Main Topics  . Smoking status: Never Smoker  . Smokeless tobacco: Never Used  . Alcohol use No  . Drug use: No  . Sexual activity: No   Other Topics Concern  . Not on file   Social History Narrative   Marital status: single; not dating in 2017.  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      Lives: Patient lives at home with her grandmother [name] and [name] in independent living where [employer].  Mom in [place]; dad [place].       Education: Patient has 12th grade education. Attends [employer] Monday through Friday 10:00-4:00.      Employment:  Disability      Education: attends [employer]; M-F 10-4      Tobacco: none      Alcohol: none      Exercise: walking; walks on weekends with caregiver. Physical activity at school; goes to [facility] twice per day.   Right handed.   Caffeine- Couple times a week.- One   Family History  Problem  Relation Age of Onset  . Seizures Mother   . Hypothyroidism Mother   . Diabetes Father   . Seizures Sister   . Mental retardation Sister        Objective:    BP 116/76 (BP Location: Left Arm, Cuff Size: Large)   Pulse 76   Temp 98.5 F (36.9 C) (Oral)   Resp 18   Wt 189 lb (85.7 kg)   SpO2 98%   BMI 32.67 kg/m  Physical Exam  Constitutional: She is oriented to person, place, and time. She appears well-developed and well-nourished. No distress.  HENT:  Head: Normocephalic and atraumatic.  Right Ear: External ear normal.  Left Ear: External ear normal.  Nose: Nose normal.  Mouth/Throat: Oropharynx is clear and moist.  Eyes: Conjunctivae and EOM are normal. Pupils are equal, round, and reactive to light.  Neck: Normal range of motion and full passive range of motion without pain. Neck supple. No JVD present. Carotid bruit is not present. No thyromegaly present.  Cardiovascular: Normal rate, regular rhythm and normal heart sounds.  Exam reveals no gallop and no friction rub.   No murmur heard. Pulmonary/Chest: Effort normal and breath sounds normal. She has no wheezes. She  has no rales.  Abdominal: Soft. Bowel sounds are normal. She exhibits no distension and no mass. There is no tenderness. There is no rebound and no guarding.  Musculoskeletal:       Right shoulder: Normal.       Left shoulder: Normal.       Cervical back: Normal.  Lymphadenopathy:    She has no cervical adenopathy.  Neurological: She is alert and oriented to person, place, and time. She has normal reflexes. No cranial nerve deficit. She exhibits normal muscle tone. Coordination normal.  Skin: Skin is warm and dry. No rash noted. She is not diaphoretic. No erythema. No pallor.  Psychiatric: She has a normal mood and affect. Her behavior is normal. Judgment and thought content normal.  Nursing note and vitals reviewed.  Fall Risk  11/23/2015 09/01/2015 11/23/2014 11/06/2014 09/25/2014  Falls in the past year?  Yes No No No No  Number falls in past yr: 1 - - - -   Depression screen [facility] 2/9 11/23/2015 09/01/2015 01/17/2015 12/12/2014 11/23/2014  Decreased Interest 0 0 0 0 0  Down, Depressed, Hopeless 0 0 0 0 0  PHQ - 2 Score 0 0 0 0 0        Assessment & Plan:   1. Encounter for Medicare annual wellness exam   2. Chronic seasonal allergic rhinitis due to pollen   3. Slow transit constipation   4. Primary ovarian insufficiency   5. Conductive hearing loss, bilateral   6. Attention deficit hyperactivity disorder (ADHD), combined type   7. Seizures ([place])   8. Screening for HIV (human immunodeficiency virus)   9. Screening for diabetes mellitus   10. Screening, lipid   11. Pure hypercholesterolemia   12. Class 1 obesity due to excess calories without serious comorbidity with body mass index (BMI) of 32.0 to 32.9 in adult    -anticipatory guidance --- exercise, weight loss, 3 servings of dairy daily. -gynecological care per Delsa Sale. -obtain age appropriate screening labs. -refill of Miralax for chronic constipation -followed closely by psychiatry.   Orders Placed This Encounter  Procedures  . CBC with Differential/Platelet  . Comprehensive metabolic panel    Order Specific Question:   Has the patient fasted?    Answer:   Yes  . HIV antibody  . Lipid panel    Order Specific Question:   Has the patient fasted?    Answer:   Yes  . POCT urinalysis dipstick   Meds ordered this encounter  Medications  . polyethylene glycol powder (GLYCOLAX/MIRALAX) powder    Sig: Take 17 g by mouth daily.    Dispense:  3350 g    Refill:  11    No Follow-up on file.   Alisha Burgo Elayne Guerin, M.D. [facility] [address] [place], [place]  [address] [phone] phone [phone] fax

## 2015-12-01 DIAGNOSIS — E7800 Pure hypercholesterolemia, unspecified: Secondary | ICD-10-CM | POA: Insufficient documentation

## 2015-12-01 DIAGNOSIS — Z6832 Body mass index (BMI) 32.0-32.9, adult: Secondary | ICD-10-CM

## 2015-12-01 DIAGNOSIS — E6609 Other obesity due to excess calories: Secondary | ICD-10-CM | POA: Insufficient documentation

## 2016-01-22 ENCOUNTER — Encounter: Payer: Self-pay | Admitting: Family Medicine

## 2016-02-09 DIAGNOSIS — F70 Mild intellectual disabilities: Secondary | ICD-10-CM | POA: Diagnosis not present

## 2016-02-09 DIAGNOSIS — F840 Autistic disorder: Secondary | ICD-10-CM | POA: Diagnosis not present

## 2016-02-09 DIAGNOSIS — F900 Attention-deficit hyperactivity disorder, predominantly inattentive type: Secondary | ICD-10-CM | POA: Diagnosis not present

## 2016-02-21 DIAGNOSIS — Z01411 Encounter for gynecological examination (general) (routine) with abnormal findings: Secondary | ICD-10-CM | POA: Diagnosis not present

## 2016-02-21 DIAGNOSIS — Z0000 Encounter for general adult medical examination without abnormal findings: Secondary | ICD-10-CM | POA: Diagnosis not present

## 2016-02-21 DIAGNOSIS — N910 Primary amenorrhea: Secondary | ICD-10-CM | POA: Diagnosis not present

## 2016-02-21 DIAGNOSIS — Q8789 Other specified congenital malformation syndromes, not elsewhere classified: Secondary | ICD-10-CM | POA: Diagnosis not present

## 2016-02-21 DIAGNOSIS — Z124 Encounter for screening for malignant neoplasm of cervix: Secondary | ICD-10-CM | POA: Diagnosis not present

## 2016-02-25 ENCOUNTER — Ambulatory Visit (INDEPENDENT_AMBULATORY_CARE_PROVIDER_SITE_OTHER): Payer: Medicare Other | Admitting: Physician Assistant

## 2016-02-25 VITALS — BP 108/68 | HR 81 | Temp 98.6°F | Resp 16 | Ht 63.78 in | Wt 189.0 lb

## 2016-02-25 DIAGNOSIS — R05 Cough: Secondary | ICD-10-CM | POA: Diagnosis not present

## 2016-02-25 DIAGNOSIS — R059 Cough, unspecified: Secondary | ICD-10-CM

## 2016-02-25 DIAGNOSIS — J029 Acute pharyngitis, unspecified: Secondary | ICD-10-CM

## 2016-02-25 DIAGNOSIS — R0981 Nasal congestion: Secondary | ICD-10-CM | POA: Diagnosis not present

## 2016-02-25 NOTE — Progress Notes (Signed)
Cheryl Dominguez  MRN: [id number] DOB: 03/17/1990  PCP: [name], MD  Subjective:  Pt is a 26 year old female PMH fetal valproate syndrome who presents to clinic for sore throat x four days. She is here today with her health care POA. Some difficulty swallowing. Endorses nasal congestion. Cough is not worse at night. She is sleeping well. Appetite is good. Overall, she is feeling much better today.  Denies fever, chills, sneezing, headache, chest pain, SOB, wheezing.  She has tried Flonase and nasal spray.  She is going to a funeral for her father today.   Review of Systems  Constitutional: Negative for chills, diaphoresis, fatigue and fever.  HENT: Positive for congestion, rhinorrhea and sore throat. Negative for postnasal drip, sinus pressure and sneezing.   Respiratory: Positive for cough. Negative for chest tightness, shortness of breath and wheezing.   Cardiovascular: Negative for chest pain and palpitations.  Gastrointestinal: Negative for abdominal pain, diarrhea, nausea and vomiting.  Neurological: Negative for weakness, light-headedness and headaches.  Psychiatric/Behavioral: Negative for sleep disturbance.    Patient Active Problem List   Diagnosis Date Noted  . Class 1 obesity due to excess calories without serious comorbidity with body mass index (BMI) of 32.0 to 32.9 in adult 12/01/2015  . Pure hypercholesterolemia 12/01/2015  . Seasonal allergic rhinitis due to pollen 12/01/2014  . Slow transit constipation 12/01/2014  . Primary ovarian insufficiency 12/01/2014  . Hearing loss 12/01/2014  . UTI (urinary tract infection) 02/22/2014  . Seizures ([place]) 06/26/2012  . ADD (attention deficit disorder)     Current Outpatient Prescriptions on File Prior to Visit  Medication Sig Dispense Refill  . CALCIUM PO Take 1 capsule by mouth 2 (two) times daily.    [name] Kitchen CRANBERRY EXTRACT PO Take by mouth 2 (two) times daily.    [name] Kitchen escitalopram (LEXAPRO) 10 MG tablet Take 10 mg by  mouth at bedtime.    [name] Kitchen guanFACINE (INTUNIV) 1 MG TB24 Take by mouth daily.    [name] Kitchen lisdexamfetamine (VYVANSE) 70 MG capsule Take 50 mg by mouth every morning.     . loratadine (CLARITIN) 10 MG tablet Take 10 mg by mouth daily.    [name] Kitchen LORazepam (ATIVAN) 1 MG tablet Take 1 mg by mouth daily as needed for anxiety.    . Multiple Vitamins-Minerals (MULTIVITAMIN PO) Take by mouth 2 (two) times daily.    . norethindrone-ethinyl estradiol-iron (MICROGESTIN FE,GILDESS FE,LOESTRIN FE) 1.5-30 MG-MCG tablet Take 1 tablet by mouth daily.    . polyethylene glycol powder (GLYCOLAX/MIRALAX) powder Take 17 g by mouth daily. 3350 g 11  . risperiDONE (RISPERDAL) 0.5 MG tablet Take 1 mg by mouth daily.     [name] Kitchen lisdexamfetamine (VYVANSE) 40 MG capsule Take 40 mg by mouth every morning. Reported on 01/17/2015    . metroNIDAZOLE (METROGEL VAGINAL) 0.75 % vaginal gel Place 1 Applicatorful vaginally at bedtime. For 5 days (Patient not taking: Reported on 11/23/2015) 70 g 0   No current facility-administered medications on file prior to visit.     No Known Allergies   Objective:  BP 108/68   Pulse 81   Temp 98.6 F (37 C) (Oral)   Resp 16   Ht 5' 3.78" (1.62 m)   Wt 189 lb (85.7 kg)   SpO2 99%   BMI 32.67 kg/m   Physical Exam  Constitutional: She is oriented to person, place, and time and well-developed, well-nourished, and in no distress. No distress.  HENT:  Right Ear: Tympanic membrane normal.  Left  Ear: Tympanic membrane normal.  Nose: Mucosal edema and rhinorrhea present. Right sinus exhibits no maxillary sinus tenderness and no frontal sinus tenderness. Left sinus exhibits no maxillary sinus tenderness and no frontal sinus tenderness.  Mouth/Throat: Mucous membranes are normal. Posterior oropharyngeal edema present. No oropharyngeal exudate or posterior oropharyngeal erythema.  Cardiovascular: Normal rate, regular rhythm and normal heart sounds.   Pulmonary/Chest: Effort normal and breath sounds normal.    Neurological: She is alert and oriented to person, place, and time. GCS score is 15.  Skin: Skin is warm and dry.  Psychiatric: Mood, memory, affect and judgment normal.  Vitals reviewed.   Assessment and Plan :  1. Pharyngitis, unspecified etiology 2. Nasal congestion 3. Cough - Pt is improving. No need to test for strep or EBV at this time. Continue using nasal spray and Flonase. Add salt water gargles and warm tea with honey. RTC if symptoms persist/do not improve.   Mercer Pod, PA-C  [facility] 02/25/2016 8:14 AM

## 2016-02-25 NOTE — Patient Instructions (Addendum)
Follow these instructions at home:  Drink enough water and fluids to keep your urine clear or pale yellow.  Get lots of rest.  Gargle with 8 oz of salt water ( tsp of salt per 1 qt of water) as often as every 1-2 hours to soothe your throat.  Throat lozenges (if you are not at risk for choking) or sprays may be used to soothe your throat.  Warm tea with honey and lemon   Thank you for coming in today. I hope you feel we met your needs.  Feel free to call [facility] if you have any questions or further requests.  Please consider signing up for MyChart if you do not already have it, as this is a great way to communicate with me.  Best,  [employer], PA-C

## 2016-03-07 DIAGNOSIS — H5034 Intermittent alternating exotropia: Secondary | ICD-10-CM | POA: Diagnosis not present

## 2016-03-07 DIAGNOSIS — H4423 Degenerative myopia, bilateral: Secondary | ICD-10-CM | POA: Diagnosis not present

## 2016-05-03 DIAGNOSIS — F70 Mild intellectual disabilities: Secondary | ICD-10-CM | POA: Diagnosis not present

## 2016-05-03 DIAGNOSIS — F411 Generalized anxiety disorder: Secondary | ICD-10-CM | POA: Diagnosis not present

## 2016-05-03 DIAGNOSIS — F900 Attention-deficit hyperactivity disorder, predominantly inattentive type: Secondary | ICD-10-CM | POA: Diagnosis not present

## 2016-05-03 DIAGNOSIS — F840 Autistic disorder: Secondary | ICD-10-CM | POA: Diagnosis not present

## 2016-06-27 ENCOUNTER — Encounter: Payer: Self-pay | Admitting: Family Medicine

## 2016-06-28 ENCOUNTER — Ambulatory Visit (INDEPENDENT_AMBULATORY_CARE_PROVIDER_SITE_OTHER): Payer: Medicare Other | Admitting: Family Medicine

## 2016-06-28 ENCOUNTER — Encounter: Payer: Self-pay | Admitting: Family Medicine

## 2016-06-28 ENCOUNTER — Other Ambulatory Visit: Payer: Self-pay | Admitting: Family Medicine

## 2016-06-28 VITALS — BP 104/70 | HR 84 | Temp 98.0°F | Resp 18 | Ht 63.39 in | Wt 187.0 lb

## 2016-06-28 DIAGNOSIS — F902 Attention-deficit hyperactivity disorder, combined type: Secondary | ICD-10-CM | POA: Diagnosis not present

## 2016-06-28 DIAGNOSIS — E6609 Other obesity due to excess calories: Secondary | ICD-10-CM | POA: Diagnosis not present

## 2016-06-28 DIAGNOSIS — R569 Unspecified convulsions: Secondary | ICD-10-CM

## 2016-06-28 DIAGNOSIS — Z6832 Body mass index (BMI) 32.0-32.9, adult: Secondary | ICD-10-CM | POA: Diagnosis not present

## 2016-06-28 DIAGNOSIS — E7800 Pure hypercholesterolemia, unspecified: Secondary | ICD-10-CM

## 2016-06-28 DIAGNOSIS — R451 Restlessness and agitation: Secondary | ICD-10-CM

## 2016-06-28 DIAGNOSIS — Q868 Other congenital malformation syndromes due to known exogenous causes: Secondary | ICD-10-CM

## 2016-06-28 DIAGNOSIS — Z131 Encounter for screening for diabetes mellitus: Secondary | ICD-10-CM

## 2016-06-28 DIAGNOSIS — Q8789 Other specified congenital malformation syndromes, not elsewhere classified: Secondary | ICD-10-CM

## 2016-06-28 DIAGNOSIS — F819 Developmental disorder of scholastic skills, unspecified: Secondary | ICD-10-CM

## 2016-06-28 DIAGNOSIS — F439 Reaction to severe stress, unspecified: Secondary | ICD-10-CM | POA: Diagnosis not present

## 2016-06-28 DIAGNOSIS — E2839 Other primary ovarian failure: Secondary | ICD-10-CM | POA: Diagnosis not present

## 2016-06-28 LAB — POCT URINALYSIS DIP (MANUAL ENTRY)
Bilirubin, UA: NEGATIVE
Blood, UA: NEGATIVE
Glucose, UA: NEGATIVE mg/dL
Ketones, POC UA: NEGATIVE mg/dL
NITRITE UA: NEGATIVE
PROTEIN UA: NEGATIVE mg/dL
Spec Grav, UA: 1.015 (ref 1.010–1.025)
Urobilinogen, UA: 0.2 E.U./dL
pH, UA: 8.5 — AB (ref 5.0–8.0)

## 2016-06-28 MED ORDER — LORATADINE 10 MG PO TABS: 10.0000 mg | ORAL_TABLET | Freq: Every day | ORAL | 3 refills | Status: DC

## 2016-06-28 MED ORDER — FLUTICASONE PROPIONATE 50 MCG/ACT NA SUSP: 2.0000 | Freq: Every day | NASAL | 11 refills | Status: DC

## 2016-06-28 MED ORDER — CALCIUM CARBONATE-VITAMIN D 500-200 MG-UNIT PO TABS: 1.0000 | ORAL_TABLET | Freq: Two times a day (BID) | ORAL | 3 refills | Status: AC

## 2016-06-28 MED ORDER — KETOCONAZOLE 2 % EX CREA: 1.0000 "application " | TOPICAL_CREAM | Freq: Two times a day (BID) | CUTANEOUS | 0 refills | Status: DC

## 2016-06-28 MED ORDER — CRANBERRY/PROBIOTIC/VIT C 450-30 MG PO TABS: 1.0000 | ORAL_TABLET | Freq: Every day | ORAL | 3 refills | Status: DC

## 2016-06-28 NOTE — Patient Instructions (Signed)
     IF you received an x-ray today, you will receive an invoice from [facility]. Please contact [facility] at [phone] with questions or concerns regarding your invoice.   IF you received labwork today, you will receive an invoice from [facility]. Please contact [facility] at [phone] with questions or concerns regarding your invoice.   Our billing staff will not be able to assist you with questions regarding bills from these companies.  You will be contacted with the lab results as soon as they are available. The fastest way to get your results is to activate your My Chart account. Instructions are located on the last page of this paperwork. If you have not heard from us regarding the results in 2 weeks, please contact this office.     

## 2016-06-28 NOTE — Progress Notes (Signed)
Subjective:    Patient ID: Cheryl Dominguez, female    DOB: 18-Feb-1990, 26 y.o.   MRN: [id number]  06/28/2016  Agitation (with some aggrestive issuse for the past few months)   HPI This 26 y.o. female presents with caregiveer for evaluation of agitation/irritability.  Onset in past two or three months.  This week, yelled and threw some objects and slammed a few doors.  Medications have been stable and unchanged for quite a while.  There is a lot of transition going on; Cheryl Dominguez's grandma has some health issues and must go out to rehab for a while.  Now grandma is back home. There has been two physical moves in the past year.  Has been living in [place] since Christmas.  Lexapro increased from 10mg  to 20mg ; mood is better.  Irritability is still frequent. Degree of episodes depends on environment.  Bleeding into each environment yet more at home and [employer].    Concern about thyroid, blood sugar, brain, hormones. Has been maintained on OCP for over the year.  Does three packs and then does not have menses every third month.  No breakthrough bleeding.  Saw [facility] in February.    Family is also worried about brain tumor.  No headaches.   No dizziness.   S/p eye exam recently; rx stayed the same; no blurred vision.  No nausea or vomiting.  Super dry and cracked feet; might have a wart on LEFT lateral foot.  Wears sneakers a lot.  Wears sandles on weekend.s  Review of Systems  Constitutional: Negative for chills, diaphoresis, fatigue and fever.  Eyes: Negative for visual disturbance.  Respiratory: Negative for cough and shortness of breath.   Cardiovascular: Negative for chest pain, palpitations and leg swelling.  Gastrointestinal: Negative for abdominal pain, constipation, diarrhea, nausea and vomiting.  Endocrine: Negative for cold intolerance, heat intolerance, polydipsia, polyphagia and polyuria.  Skin: Positive for rash.  Neurological: Negative for dizziness, tremors, seizures,  syncope, facial asymmetry, speech difficulty, weakness, light-headedness, numbness and headaches.  Psychiatric/Behavioral: Positive for agitation and behavioral problems. Negative for dysphoric mood, self-injury, sleep disturbance and suicidal ideas. The patient is not nervous/anxious.     Past Medical History:  Diagnosis Date  . ADD (attention deficit disorder)   . ADHD (attention deficit hyperactivity disorder)   . Allergy   . Attention deficit disorder   . Congenital abnormalities   . Constipation   . 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 Hearing loss   . Primary ovarian insufficiency   . Screening for mental disease/developmental disorder    Past Surgical History:  Procedure Laterality Date  . BACK SURGERY     scoliosis; teenage   No Known Allergies  Social History   Social History  . Marital status: Single    Spouse name: N/A  . Number of children: 0  . Years of education: 12   Occupational History  . disability     works at [employer]   Social History Main Topics  . Smoking status: Never Smoker  . Smokeless tobacco: Never Used  . Alcohol use No  . Drug use: No  . Sexual activity: No   Other Topics Concern  . Not on file   Social History Narrative   Marital status: single; not dating in 2017.  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      Lives: Patient lives at home with her grandmother  [name] and [name] in independent living where [employer].  Mom in [place]; dad [place].       Education: Patient has 12th grade education. Attends [employer] Monday through Friday 10:00-4:00.      Employment:  Disability      Education: attends [employer]; M-F 10-4      Tobacco: none      Alcohol: none      Exercise: walking; walks on weekends with caregiver. Physical activity at school; goes to [facility] twice per day.    Right handed.   Caffeine- Couple times a week.- One   Family History  Problem Relation Age of Onset  . Seizures Mother   . Hypothyroidism Mother   . Diabetes Father   . Seizures Sister   . Mental retardation Sister        Objective:    BP 104/70   Pulse 84   Temp 98 F (36.7 C) (Oral)   Resp 18   Ht 5' 3.39" (1.61 m)   Wt 187 lb (84.8 kg)   LMP 05/19/2016   SpO2 98%   BMI 32.72 kg/m  Physical Exam  Constitutional: She is oriented to person, place, and time. She appears well-developed and well-nourished. No distress.  HENT:  Head: Normocephalic and atraumatic.  Right Ear: External ear normal.  Left Ear: External ear normal.  Nose: Nose normal.  Mouth/Throat: Oropharynx is clear and moist.  Eyes: Conjunctivae and EOM are normal. Pupils are equal, round, and reactive to light.  Neck: Normal range of motion. Neck supple. Carotid bruit is not present. No thyromegaly present.  Cardiovascular: Normal rate, regular rhythm, normal heart sounds and intact distal pulses.  Exam reveals no gallop and no friction rub.   No murmur heard. Pulmonary/Chest: Effort normal and breath sounds normal. She has no wheezes. She has no rales.  Abdominal: Soft. Bowel sounds are normal. She exhibits no distension and no mass. There is no tenderness. There is no rebound and no guarding.  Lymphadenopathy:    She has no cervical adenopathy.  Neurological: She is alert and oriented to person, place, and time. No cranial nerve deficit.  Skin: Skin is warm and dry. No rash noted. She is not diaphoretic. No erythema. No pallor.  +diffuse scaling of feet yet no interdigit involvement.  Psychiatric: She has a normal mood and affect. Her behavior is normal.   Depression screen [facility] 2/9 06/28/2016 02/25/2016 11/23/2015 09/01/2015 01/17/2015  Decreased Interest 0 0 0 0 0  Down, Depressed, Hopeless 0 0 0 0 0  PHQ - 2 Score 0 0 0 0 0        Assessment & Plan:   1. Agitation   2. Attention deficit  hyperactivity disorder (ADHD), combined type   3. Seizures ([place])   4. Pure hypercholesterolemia   5. Primary ovarian insufficiency   6. Mental developmental delay   7. Screening for diabetes mellitus   8. Fetal valproate syndrome   9. Class 1 obesity due to excess calories without serious comorbidity with body mass index (BMI) of 32.0 to 32.9 in adult   10. Stress    -new onset agitation and behavior issues; significant stressors in the past year including moving twice and grandmother/guardian with significant decline in health.  -current symptoms most consistent with acute stress reaction and adjustment disorder.  Recommend follow-up with psychiatry; likely would benefit from increase in Ripserdal. -obtain labs to rule out secondary etiology  to agitation. -diffuse scaling to B feet; ddx includes dishydrotic eczema versus tinea.  Treat with Ketoconazole; if no improvement, prescribe Triamcinolone.   Orders Placed This Encounter  Procedures  . CBC with Differential/Platelet  . Comprehensive metabolic panel  . TSH  . POCT urinalysis dipstick   Meds ordered this encounter  Medications  . fluticasone (FLONASE) 50 MCG/ACT nasal spray    Sig: Place 2 sprays into both nostrils daily.    Dispense:  16 g    Refill:  11  . loratadine (CLARITIN) 10 MG tablet    Sig: Take 1 tablet (10 mg total) by mouth daily.    Dispense:  90 tablet    Refill:  3  . calcium-vitamin D (OSCAL WITH D) 500-200 MG-UNIT tablet    Sig: Take 1 tablet by mouth 2 (two) times daily.    Dispense:  180 tablet    Refill:  3  . Cranberry-Vit C-Lactobacillus (CRANBERRY/PROBIOTIC/VIT C) 450-30 MG TABS    Sig: Take 1 tablet by mouth daily.    Dispense:  100 tablet    Refill:  3  . ketoconazole (NIZORAL) 2 % cream    Sig: Apply 1 application topically 2 (two) times daily.    Dispense:  60 g    Refill:  0    No Follow-up on file.   Rayn Shorb Elayne Guerin, M.D. Primary Care at [facility] previously Ur[facility]  [place] [address] [place], [place]  [address] [phone] phone [phone] fax

## 2016-06-29 LAB — COMPREHENSIVE METABOLIC PANEL
A/G RATIO: 1.3 (ref 1.2–2.2)
ALT: 29 IU/L (ref 0–32)
AST: 18 IU/L (ref 0–40)
Albumin: 3.8 g/dL (ref 3.5–5.5)
Alkaline Phosphatase: 66 IU/L (ref 39–117)
BUN/Creatinine Ratio: 15 (ref 9–23)
BUN: 11 mg/dL (ref 6–20)
CHLORIDE: 101 mmol/L (ref 96–106)
CO2: 28 mmol/L (ref 20–29)
Calcium: 9.3 mg/dL (ref 8.7–10.2)
Creatinine, Ser: 0.74 mg/dL (ref 0.57–1.00)
GFR calc Af Amer: 128 mL/min/{1.73_m2} (ref >59)
GFR calc non Af Amer: 111 mL/min/{1.73_m2} (ref >59)
Globulin, Total: 3 g/dL (ref 1.5–4.5)
Glucose: 89 mg/dL (ref 65–99)
POTASSIUM: 4.6 mmol/L (ref 3.5–5.2)
Sodium: 142 mmol/L (ref 134–144)
TOTAL PROTEIN: 6.8 g/dL (ref 6.0–8.5)

## 2016-06-29 LAB — CBC WITH DIFFERENTIAL/PLATELET
BASOS: 0 %
Basophils Absolute: 0 10*3/uL (ref 0.0–0.2)
EOS (ABSOLUTE): 0.1 10*3/uL (ref 0.0–0.4)
Eos: 1 %
Hematocrit: 43.3 % (ref 34.0–46.6)
Hemoglobin: 14.1 g/dL (ref 11.1–15.9)
IMMATURE GRANS (ABS): 0 10*3/uL (ref 0.0–0.1)
Immature Granulocytes: 0 %
Lymphocytes Absolute: 2.4 10*3/uL (ref 0.7–3.1)
Lymphs: 32 %
MCH: 31.3 pg (ref 26.6–33.0)
MCHC: 32.6 g/dL (ref 31.5–35.7)
MCV: 96 fL (ref 79–97)
MONOS ABS: 0.4 10*3/uL (ref 0.1–0.9)
Monocytes: 5 %
NEUTROS ABS: 4.6 10*3/uL (ref 1.4–7.0)
Neutrophils: 62 %
PLATELETS: 281 10*3/uL (ref 150–379)
RBC: 4.51 x10E6/uL (ref 3.77–5.28)
RDW: 13.4 % (ref 12.3–15.4)
WBC: 7.5 10*3/uL (ref 3.4–10.8)

## 2016-06-29 LAB — TSH: TSH: 4.79 u[IU]/mL — ABNORMAL HIGH (ref 0.450–4.500)

## 2016-07-11 ENCOUNTER — Telehealth: Payer: Self-pay | Admitting: Family Medicine

## 2016-07-11 NOTE — Telephone Encounter (Signed)
Dr. Tamala Julian, see this message , please. Thanks.
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
## 2016-07-14 MED ORDER — KETOCONAZOLE 2 % EX CREA: 1.0000 "application " | TOPICAL_CREAM | Freq: Two times a day (BID) | CUTANEOUS | 0 refills | Status: DC

## 2016-08-02 DIAGNOSIS — F900 Attention-deficit hyperactivity disorder, predominantly inattentive type: Secondary | ICD-10-CM | POA: Diagnosis not present

## 2016-08-02 DIAGNOSIS — F70 Mild intellectual disabilities: Secondary | ICD-10-CM | POA: Diagnosis not present

## 2016-08-02 DIAGNOSIS — F349 Persistent mood [affective] disorder, unspecified: Secondary | ICD-10-CM | POA: Diagnosis not present

## 2016-08-02 DIAGNOSIS — F639 Impulse disorder, unspecified: Secondary | ICD-10-CM | POA: Diagnosis not present

## 2016-09-26 DIAGNOSIS — F840 Autistic disorder: Secondary | ICD-10-CM | POA: Diagnosis not present

## 2016-09-26 DIAGNOSIS — F411 Generalized anxiety disorder: Secondary | ICD-10-CM | POA: Diagnosis not present

## 2016-09-26 DIAGNOSIS — F639 Impulse disorder, unspecified: Secondary | ICD-10-CM | POA: Diagnosis not present

## 2016-09-26 DIAGNOSIS — F349 Persistent mood [affective] disorder, unspecified: Secondary | ICD-10-CM | POA: Diagnosis not present

## 2016-09-26 DIAGNOSIS — F900 Attention-deficit hyperactivity disorder, predominantly inattentive type: Secondary | ICD-10-CM | POA: Diagnosis not present

## 2016-09-26 DIAGNOSIS — F70 Mild intellectual disabilities: Secondary | ICD-10-CM | POA: Diagnosis not present

## 2016-09-27 ENCOUNTER — Encounter: Payer: Self-pay | Admitting: Family Medicine

## 2016-10-07 ENCOUNTER — Encounter: Payer: Self-pay | Admitting: Family Medicine

## 2016-10-07 ENCOUNTER — Ambulatory Visit (INDEPENDENT_AMBULATORY_CARE_PROVIDER_SITE_OTHER): Payer: Medicare Other | Admitting: Family Medicine

## 2016-10-07 DIAGNOSIS — Z23 Encounter for immunization: Secondary | ICD-10-CM

## 2016-10-08 NOTE — Progress Notes (Signed)
Flu shot only. Not seen by provider.

## 2016-11-15 DIAGNOSIS — F840 Autistic disorder: Secondary | ICD-10-CM | POA: Diagnosis not present

## 2016-11-15 DIAGNOSIS — F900 Attention-deficit hyperactivity disorder, predominantly inattentive type: Secondary | ICD-10-CM | POA: Diagnosis not present

## 2016-11-15 DIAGNOSIS — F411 Generalized anxiety disorder: Secondary | ICD-10-CM | POA: Diagnosis not present

## 2016-11-25 ENCOUNTER — Other Ambulatory Visit: Payer: Self-pay | Admitting: Family Medicine

## 2016-12-04 ENCOUNTER — Ambulatory Visit (INDEPENDENT_AMBULATORY_CARE_PROVIDER_SITE_OTHER): Payer: Medicare Other

## 2016-12-04 VITALS — BP 104/70 | HR 115 | Temp 98.1°F | Ht 63.0 in | Wt 188.4 lb

## 2016-12-04 DIAGNOSIS — Z0000 Encounter for general adult medical examination without abnormal findings: Secondary | ICD-10-CM | POA: Diagnosis not present

## 2016-12-04 DIAGNOSIS — E7800 Pure hypercholesterolemia, unspecified: Secondary | ICD-10-CM | POA: Diagnosis not present

## 2016-12-04 LAB — LIPID PANEL
Chol/HDL Ratio: 2.9 ratio (ref 0.0–4.4)
Cholesterol, Total: 122 mg/dL (ref 100–199)
HDL: 42 mg/dL (ref >39)
LDL Calculated: 55 mg/dL (ref 0–99)
Triglycerides: 125 mg/dL (ref 0–149)
VLDL Cholesterol Cal: 25 mg/dL (ref 5–40)

## 2016-12-04 NOTE — Patient Instructions (Addendum)
Ms. Granville , Thank you for taking time to come for your Medicare Wellness Visit. I appreciate your ongoing commitment to your health goals. Please review the following plan we discussed and let me know if I can assist you in the future.   Screening recommendations/referrals: Colonoscopy: Not indicated Mammogram: not indicated Bone Density: not indicated Recommended yearly ophthalmology/optometry visit for glaucoma screening and checkup Recommended yearly dental visit for hygiene and checkup  Vaccinations: Influenza vaccine: up to date Pneumococcal vaccine: not indicated Tdap vaccine: up to date, next due 01/24/2021 Shingles vaccine: not indicated    Advanced directives: copy in chart  Conditions/risks identified: Try to eat more fruits and vegetables on a daily basis.   Next appointment: schedule follow up visit with PCP, 1 year for AWV  Preventive Care 40-64 Years, Female Preventive care refers to lifestyle choices and visits with your health care provider that can promote health and wellness. What does preventive care include?  A yearly physical exam. This is also called an annual well check.  Dental exams once or twice a year.  Routine eye exams. Ask your health care provider how often you should have your eyes checked.  Personal lifestyle choices, including:  Daily care of your teeth and gums.  Regular physical activity.  Eating a healthy diet.  Avoiding tobacco and drug use.  Limiting alcohol use.  Practicing safe sex.  Taking low-dose aspirin daily starting at age 39.  Taking vitamin and mineral supplements as recommended by your health care provider. What happens during an annual well check? The services and screenings done by your health care provider during your annual well check will depend on your age, overall health, lifestyle risk factors, and family history of disease. Counseling  Your health care provider may ask you questions about your:  Alcohol  use.  Tobacco use.  Drug use.  Emotional well-being.  Home and relationship well-being.  Sexual activity.  Eating habits.  Work and work Statistician.  Method of birth control.  Menstrual cycle.  Pregnancy history. Screening  You may have the following tests or measurements:  Height, weight, and BMI.  Blood pressure.  Lipid and cholesterol levels. These may be checked every 5 years, or more frequently if you are over 37 years old.  Skin check.  Lung cancer screening. You may have this screening every year starting at age 1 if you have a 30-pack-year history of smoking and currently smoke or have quit within the past 15 years.  Fecal occult blood test (FOBT) of the stool. You may have this test every year starting at age 8.  Flexible sigmoidoscopy or colonoscopy. You may have a sigmoidoscopy every 5 years or a colonoscopy every 10 years starting at age 20.  Hepatitis C blood test.  Hepatitis B blood test.  Sexually transmitted disease (STD) testing.  Diabetes screening. This is done by checking your blood sugar (glucose) after you have not eaten for a while (fasting). You may have this done every 1-3 years.  Mammogram. This may be done every 1-2 years. Talk to your health care provider about when you should start having regular mammograms. This may depend on whether you have a family history of breast cancer.  BRCA-related cancer screening. This may be done if you have a family history of breast, ovarian, tubal, or peritoneal cancers.  Pelvic exam and Pap test. This may be done every 3 years starting at age 38. Starting at age 50, this may be done every 5 years if you  have a Pap test in combination with an HPV test.  Bone density scan. This is done to screen for osteoporosis. You may have this scan if you are at high risk for osteoporosis. Discuss your test results, treatment options, and if necessary, the need for more tests with your health care  provider. Vaccines  Your health care provider may recommend certain vaccines, such as:  Influenza vaccine. This is recommended every year.  Tetanus, diphtheria, and acellular pertussis (Tdap, Td) vaccine. You may need a Td booster every 10 years.  Zoster vaccine. You may need this after age 78.  Pneumococcal 13-valent conjugate (PCV13) vaccine. You may need this if you have certain conditions and were not previously vaccinated.  Pneumococcal polysaccharide (PPSV23) vaccine. You may need one or two doses if you smoke cigarettes or if you have certain conditions. Talk to your health care provider about which screenings and vaccines you need and how often you need them. This information is not intended to replace advice given to you by your health care provider. Make sure you discuss any questions you have with your health care provider. Document Released: 01/15/2015 Document Revised: 09/08/2015 Document Reviewed: 10/20/2014 Elsevier Interactive Patient Education  2017 [employer] Prevention in the Home Falls can cause injuries. They can happen to people of all ages. There are many things you can do to make your home safe and to help prevent falls. What can I do on the outside of my home?  Regularly fix the edges of walkways and driveways and fix any cracks.  Remove anything that might make you trip as you walk through a door, such as a raised step or threshold.  Trim any bushes or trees on the path to your home.  Use bright outdoor lighting.  Clear any walking paths of anything that might make someone trip, such as rocks or tools.  Regularly check to see if handrails are loose or broken. Make sure that both sides of any steps have handrails.  Any raised decks and porches should have guardrails on the edges.  Have any leaves, snow, or ice cleared regularly.  Use sand or salt on walking paths during winter.  Clean up any spills in your garage right away. This includes  oil or grease spills. What can I do in the bathroom?  Use night lights.  Install grab bars by the toilet and in the tub and shower. Do not use towel bars as grab bars.  Use non-skid mats or decals in the tub or shower.  If you need to sit down in the shower, use a plastic, non-slip stool.  Keep the floor dry. Clean up any water that spills on the floor as soon as it happens.  Remove soap buildup in the tub or shower regularly.  Attach bath mats securely with double-sided non-slip rug tape.  Do not have throw rugs and other things on the floor that can make you trip. What can I do in the bedroom?  Use night lights.  Make sure that you have a light by your bed that is easy to reach.  Do not use any sheets or blankets that are too big for your bed. They should not hang down onto the floor.  Have a firm chair that has side arms. You can use this for support while you get dressed.  Do not have throw rugs and other things on the floor that can make you trip. What can I do in the kitchen?  Clean up any spills right away.  Avoid walking on wet floors.  Keep items that you use a lot in easy-to-reach places.  If you need to reach something above you, use a strong step stool that has a grab bar.  Keep electrical cords out of the way.  Do not use floor polish or wax that makes floors slippery. If you must use wax, use non-skid floor wax.  Do not have throw rugs and other things on the floor that can make you trip. What can I do with my stairs?  Do not leave any items on the stairs.  Make sure that there are handrails on both sides of the stairs and use them. Fix handrails that are broken or loose. Make sure that handrails are as long as the stairways.  Check any carpeting to make sure that it is firmly attached to the stairs. Fix any carpet that is loose or worn.  Avoid having throw rugs at the top or bottom of the stairs. If you do have throw rugs, attach them to the floor  with carpet tape.  Make sure that you have a light switch at the top of the stairs and the bottom of the stairs. If you do not have them, ask someone to add them for you. What else can I do to help prevent falls?  Wear shoes that:  Do not have high heels.  Have rubber bottoms.  Are comfortable and fit you well.  Are closed at the toe. Do not wear sandals.  If you use a stepladder:  Make sure that it is fully opened. Do not climb a closed stepladder.  Make sure that both sides of the stepladder are locked into place.  Ask someone to hold it for you, if possible.  Clearly mark and make sure that you can see:  Any grab bars or handrails.  First and last steps.  Where the edge of each step is.  Use tools that help you move around (mobility aids) if they are needed. These include:  Canes.  Walkers.  Scooters.  Crutches.  Turn on the lights when you go into a dark area. Replace any light bulbs as soon as they burn out.  Set up your furniture so you have a clear path. Avoid moving your furniture around.  If any of your floors are uneven, fix them.  If there are any pets around you, be aware of where they are.  Review your medicines with your doctor. Some medicines can make you feel dizzy. This can increase your chance of falling. Ask your doctor what other things that you can do to help prevent falls. This information is not intended to replace advice given to you by your health care provider. Make sure you discuss any questions you have with your health care provider. Document Released: 10/15/2008 Document Revised: 05/27/2015 Document Reviewed: 01/23/2014 Elsevier Interactive Patient Education  2017 [employer].

## 2016-12-04 NOTE — Progress Notes (Signed)
Subjective:   Cheryl Dominguez is a 26 y.o. female who presents for Medicare Annual (Subsequent) preventive examination.  Review of Systems:  N/A Cardiac Risk Factors include: dyslipidemia;obesity (BMI >30kg/m2)     Objective:     Vitals: BP 104/70   Pulse (!) 115   Temp 98.1 F (36.7 C) (Oral)   Ht 5\' 3"  (1.6 m)   Wt 188 lb 6 oz (85.4 kg)   BMI 33.37 kg/m   Body mass index is 33.37 kg/m.  Advanced Directives 12/04/2016 03/21/2014 02/19/2014  Does Patient Have a Medical Advance Directive? Yes Yes No  Type of Paramedic of [place];Living will [place] in Chart? Yes No - copy requested -  Would patient like information on creating a medical advance directive? - - No - patient declined information    Tobacco Social History   Tobacco Use  Smoking Status Never Smoker  Smokeless Tobacco Never Used     Counseling given: Not Answered   Clinical Intake:  Pre-visit preparation completed: Yes  Pain : No/denies pain     Nutritional Status: BMI > 30  Obese Nutritional Risks: None Diabetes: No  Activities of Daily Living: Independent Ambulation: Independent with device- listed below Home Assistive Devices/Equipment: Eyeglasses(hearing aids) Medication Administration: Independent Home Management: Independent  Barriers to Care Management & Learning: None  Do you feel unsafe in your current relationship?: No Do you feel physically threatened by others?: No Anyone hurting you at home, work, or school?: No Unable to ask?: No  How often do you need to have someone help you when you read instructions, pamphlets, or other written materials from your doctor or pharmacy?: 1 - Never What is the last grade level you completed in school?: 12th grade-[employer] track high school  Interpreter Needed?: No  Information entered by :: Andrez Grime, LPN  Past Medical History:  Diagnosis Date  .  ADD (attention deficit disorder)   . ADHD (attention deficit hyperactivity disorder)   . Allergy   . Attention deficit disorder   . Congenital abnormalities   . Constipation   . 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 Hearing loss   . Primary ovarian insufficiency   . Screening for mental disease/developmental disorder    Past Surgical History:  Procedure Laterality Date  . BACK SURGERY     scoliosis; teenage   Family History  Problem Relation Age of Onset  . Seizures Mother   . Hypothyroidism Mother   . Diabetes Father   . Seizures Sister   . Mental retardation Sister    Social History   Socioeconomic History  . Marital status: Single    Spouse name: None  . Number of children: 0  . Years of education: 71  . Highest education level: High school graduate  Social Needs  . Financial resource strain: Not hard at all  . Food insecurity - worry: Never true  . Food insecurity - inability: Never true  . Transportation needs - medical: No  . Transportation needs - non-medical: No  Occupational History  . Occupation: disability    Comment: works at [employer]  . Smoking status: Never Smoker  . Smokeless tobacco: Never Used  Substance and Sexual Activity  . Alcohol use: No  . Drug use: No  . Sexual activity: No    Birth  control/protection: None  Other Topics Concern  . None  Social History Narrative   Marital status: single; not dating in 2017.  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      Lives: Patient lives at home with her grandmother [name] and [name] in independent living where [employer].  Mom in [place]; dad [place].       Education: Patient has 12th grade education. Attends [employer] Monday through Friday 10:00-4:00.      Employment:  Disability      Education: attends [employer]; M-F 10-4       Tobacco: none      Alcohol: none      Exercise: walking; walks on weekends with caregiver. Physical activity at school; goes to [facility] twice per day.   Right handed.   Caffeine- Couple times a week.- One    Outpatient Encounter Medications as of 12/04/2016  Medication Sig  . calcium-vitamin D (OSCAL WITH D) 500-200 MG-UNIT tablet Take 1 tablet by mouth 2 (two) times daily.  [name] Kitchen CRANBERRY EXTRACT PO Take by mouth 2 (two) times daily.  [name] Kitchen escitalopram (LEXAPRO) 10 MG tablet Take 20 mg by mouth at bedtime.   . fluticasone (FLONASE) 50 MCG/ACT nasal spray Place 2 sprays into both nostrils daily.  [name] Kitchen guanFACINE (INTUNIV) 1 MG TB24 Take by mouth daily.  [name] Kitchen ketoconazole (NIZORAL) 2 % cream Apply 1 application topically 2 (two) times daily.  [name] Kitchen lisdexamfetamine (VYVANSE) 40 MG capsule Take 40 mg by mouth every morning. Reported on 01/17/2015  . loratadine (CLARITIN) 10 MG tablet Take 1 tablet (10 mg total) by mouth daily.  [name] Kitchen LORazepam (ATIVAN) 1 MG tablet Take 1 mg by mouth daily as needed for anxiety.  . metroNIDAZOLE (METROGEL VAGINAL) 0.75 % vaginal gel Place 1 Applicatorful vaginally at bedtime. For 5 days  . Multiple Vitamins-Minerals (MULTIVITAMIN PO) Take by mouth 2 (two) times daily.  . norethindrone-ethinyl estradiol-iron (MICROGESTIN FE,GILDESS FE,LOESTRIN FE) 1.5-30 MG-MCG tablet Take 1 tablet by mouth daily.  . polyethylene glycol powder (GLYCOLAX/MIRALAX) powder TAKE 16 GRAMS BY MOUTH DAILY  . Probiotic Product (PROBIOTIC DAILY PO) Take by mouth.  . risperiDONE (RISPERDAL) 1 MG tablet Take 1 mg by mouth daily.  . [DISCONTINUED] Cranberry-Vit C-Lactobacillus (CRANBERRY/PROBIOTIC/VIT C) 450-30 MG TABS Take 1 tablet by mouth daily.  . [DISCONTINUED] risperiDONE (RISPERDAL) 0.5 MG tablet Take 1 mg by mouth daily.    No facility-administered encounter medications on file as of 12/04/2016.     Activities of Daily Living In your present state of health, do you have any difficulty performing the  following activities: 12/04/2016  Hearing? Y  Comment Patient wears hearing aids  Vision? N  Difficulty concentrating or making decisions? N  Walking or climbing stairs? N  Dressing or bathing? N  Doing errands, shopping? N  Preparing Food and eating ? N  Using the Toilet? N  In the past six months, have you accidently leaked urine? N  Do you have problems with loss of bowel control? N  Managing your Medications? N  Managing your Finances? N  Housekeeping or managing your Housekeeping? N  Some recent data might be hidden    Timed Get Up and Go performed: yes, passed  Patient Care Team: Wardell Honour, MD as PCP - General (Family Medicine) Lahoma Crocker, MD as Consulting Physician (Obstetrics and Gynecology)    Assessment:     Exercise Activities and Dietary recommendations Current Exercise Habits: The patient does  not participate in regular exercise at present, Exercise limited by: None identified  Goals    . DIET - EAT MORE FRUITS AND VEGETABLES     Patient states that she wants to eat more fruit and vegetables on a daily basis.       Fall Risk Fall Risk  12/04/2016 06/28/2016 02/25/2016 11/23/2015 09/01/2015  Falls in the past year? No Yes No Yes No  Number falls in past yr: - 1 - 1 -  Injury with Fall? - No - - -   Is the patient's home free of loose throw rugs in walkways, pet beds, electrical cords, etc?   no      Grab bars in the bathroom? no      Handrails on the stairs?   yes      Adequate lighting?   yes  Depression Screen PHQ 2/9 Scores 12/04/2016 06/28/2016 02/25/2016 11/23/2015  PHQ - 2 Score 0 0 0 0     Cognitive Function     6CIT Screen 12/04/2016  What Year? 0 points  What month? 0 points  What time? 0 points  Count back from 20 0 points  Months in reverse 0 points  Repeat phrase 4 points  Total Score 4    Immunization History  Administered Date(s) Administered  . HPV Quadrivalent 03/27/2011  . Influenza Split 10/23/2011  .  Influenza,inj,Quad PF,6+ Mos 11/06/2014, 09/01/2015, 10/07/2016  . Tdap 01/25/2011   Screening Tests Health Maintenance  Topic Date Due  . PAP SMEAR  04/14/2013  . TETANUS/TDAP  01/24/2021  . INFLUENZA VACCINE  Completed  . HIV Screening  Completed   Cancer Screenings: Lung:  Low Dose CT Chest recommended if Age 76-80 years, 30 pack-year currently smoking OR have quit w/in 15years. Patient does not qualify. Breast:  Up to date on Mammogram? Not indicated  Up to date of Bone Density/Dexa? Not indicated  Colorectal: Not indicated   Additional Screenings:  Hepatitis B/HIV/Syphillis:not indicated Hepatitis C Screening: not indicated      Plan:   I have personally reviewed and noted the following in the patient's chart:   . Medical and social history . Use of alcohol, tobacco or illicit drugs  . Current medications and supplements . Functional ability and status . Nutritional status . Physical activity . Advanced directives . List of other physicians . Hospitalizations, surgeries, and ER visits in previous 12 months . Vitals . Screenings to include cognitive, depression, and falls . Referrals and appointments  In addition, I have reviewed and discussed with patient certain preventive protocols, quality metrics, and best practice recommendations. A written personalized care plan for preventive services as well as general preventive health recommendations were provided to patient.   1. Pure hypercholesterolemia - Lipid panel  2. Encounter for Medicare annual wellness exam  Andrez Grime, LPN  45/04/979

## 2017-01-30 DIAGNOSIS — F411 Generalized anxiety disorder: Secondary | ICD-10-CM | POA: Diagnosis not present

## 2017-01-30 DIAGNOSIS — F900 Attention-deficit hyperactivity disorder, predominantly inattentive type: Secondary | ICD-10-CM | POA: Diagnosis not present

## 2017-02-14 ENCOUNTER — Encounter: Payer: Medicare Other | Admitting: Family Medicine

## 2017-02-28 ENCOUNTER — Encounter: Payer: Medicare Other | Admitting: Family Medicine

## 2017-03-09 ENCOUNTER — Encounter: Payer: Self-pay | Admitting: Family Medicine

## 2017-03-09 ENCOUNTER — Other Ambulatory Visit: Payer: Self-pay

## 2017-03-09 ENCOUNTER — Ambulatory Visit (INDEPENDENT_AMBULATORY_CARE_PROVIDER_SITE_OTHER): Payer: Medicare Other | Admitting: Family Medicine

## 2017-03-09 VITALS — BP 116/72 | HR 100 | Temp 99.0°F | Resp 16 | Ht 63.19 in | Wt 195.0 lb

## 2017-03-09 DIAGNOSIS — R946 Abnormal results of thyroid function studies: Secondary | ICD-10-CM | POA: Diagnosis not present

## 2017-03-09 DIAGNOSIS — Z6832 Body mass index (BMI) 32.0-32.9, adult: Secondary | ICD-10-CM

## 2017-03-09 DIAGNOSIS — E7800 Pure hypercholesterolemia, unspecified: Secondary | ICD-10-CM

## 2017-03-09 DIAGNOSIS — J301 Allergic rhinitis due to pollen: Secondary | ICD-10-CM

## 2017-03-09 DIAGNOSIS — E221 Hyperprolactinemia: Secondary | ICD-10-CM

## 2017-03-09 DIAGNOSIS — F902 Attention-deficit hyperactivity disorder, combined type: Secondary | ICD-10-CM | POA: Diagnosis not present

## 2017-03-09 DIAGNOSIS — F819 Developmental disorder of scholastic skills, unspecified: Secondary | ICD-10-CM

## 2017-03-09 DIAGNOSIS — R569 Unspecified convulsions: Secondary | ICD-10-CM | POA: Diagnosis not present

## 2017-03-09 DIAGNOSIS — E6609 Other obesity due to excess calories: Secondary | ICD-10-CM

## 2017-03-09 LAB — POCT URINALYSIS DIP (MANUAL ENTRY)
BILIRUBIN UA: NEGATIVE
GLUCOSE UA: NEGATIVE mg/dL
Ketones, POC UA: NEGATIVE mg/dL
NITRITE UA: NEGATIVE
Protein Ur, POC: NEGATIVE mg/dL
RBC UA: NEGATIVE
SPEC GRAV UA: 1.025 (ref 1.010–1.025)
Urobilinogen, UA: 0.2 E.U./dL
pH, UA: 7 (ref 5.0–8.0)

## 2017-03-09 NOTE — Patient Instructions (Addendum)
   IF you received an x-ray today, you will receive an invoice from [facility]. Please contact [facility] at [phone] with questions or concerns regarding your invoice.   IF you received labwork today, you will receive an invoice from [facility]. Please contact [facility] at [phone] with questions or concerns regarding your invoice.   Our billing staff will not be able to assist you with questions regarding bills from these companies.  You will be contacted with the lab results as soon as they are available. The fastest way to get your results is to activate your My Chart account. Instructions are located on the last page of this paperwork. If you have not heard from us regarding the results in 2 weeks, please contact this office.      Preventive Care 18-39 Years, Female Preventive care refers to lifestyle choices and visits with your health care provider that can promote health and wellness. What does preventive care include?  A yearly physical exam. This is also called an annual well check.  Dental exams once or twice a year.  Routine eye exams. Ask your health care provider how often you should have your eyes checked.  Personal lifestyle choices, including: ? Daily care of your teeth and gums. ? Regular physical activity. ? Eating a healthy diet. ? Avoiding tobacco and drug use. ? Limiting alcohol use. ? Practicing safe sex. ? Taking vitamin and mineral supplements as recommended by your health care provider. What happens during an annual well check? The services and screenings done by your health care provider during your annual well check will depend on your age, overall health, lifestyle risk factors, and family history of disease. Counseling Your health care provider may ask you questions about your:  Alcohol use.  Tobacco use.  Drug use.  Emotional well-being.  Home and relationship well-being.  Sexual activity.  Eating  habits.  Work and work environment.  Method of birth control.  Menstrual cycle.  Pregnancy history.  Screening You may have the following tests or measurements:  Height, weight, and BMI.  Diabetes screening. This is done by checking your blood sugar (glucose) after you have not eaten for a while (fasting).  Blood pressure.  Lipid and cholesterol levels. These may be checked every 5 years starting at age 20.  Skin check.  Hepatitis C blood test.  Hepatitis B blood test.  Sexually transmitted disease (STD) testing.  BRCA-related cancer screening. This may be done if you have a family history of breast, ovarian, tubal, or peritoneal cancers.  Pelvic exam and Pap test. This may be done every 3 years starting at age 21. Starting at age 30, this may be done every 5 years if you have a Pap test in combination with an HPV test.  Discuss your test results, treatment options, and if necessary, the need for more tests with your health care provider. Vaccines Your health care provider may recommend certain vaccines, such as:  Influenza vaccine. This is recommended every year.  Tetanus, diphtheria, and acellular pertussis (Tdap, Td) vaccine. You may need a Td booster every 10 years.  Varicella vaccine. You may need this if you have not been vaccinated.  HPV vaccine. If you are 26 or younger, you may need three doses over 6 months.  Measles, mumps, and rubella (MMR) vaccine. You may need at least one dose of MMR. You may also need a second dose.  Pneumococcal 13-valent conjugate (PCV13) vaccine. You may need this if you have certain conditions   and were not previously vaccinated.  Pneumococcal polysaccharide (PPSV23) vaccine. You may need one or two doses if you smoke cigarettes or if you have certain conditions.  Meningococcal vaccine. One dose is recommended if you are age 66-21 years and a first-year college student living in a residence hall, or if you have one of several  medical conditions. You may also need additional booster doses.  Hepatitis A vaccine. You may need this if you have certain conditions or if you travel or work in places where you may be exposed to hepatitis A.  Hepatitis B vaccine. You may need this if you have certain conditions or if you travel or work in places where you may be exposed to hepatitis B.  Haemophilus influenzae type b (Hib) vaccine. You may need this if you have certain risk factors.  Talk to your health care provider about which screenings and vaccines you need and how often you need them. This information is not intended to replace advice given to you by your health care provider. Make sure you discuss any questions you have with your health care provider. Document Released: 02/14/2001 Document Revised: 09/08/2015 Document Reviewed: 10/20/2014 Elsevier Interactive Patient Education  [employer].

## 2017-03-09 NOTE — Progress Notes (Signed)
Subjective:    Patient ID: Cheryl Dominguez, female    DOB: [birth year], 27 y.o.   MRN: [id number]  03/09/2017  Allergic Rhinitis  and Constipation    HPI This 27 y.o. female presents for nine month follow-up.  Last physical:  AWV 12-2016 [place]. Pap smear: 02-21-2016  WNL HPV negative. Eye exam:  2 year plan; 2018. Dental exam:  01/2017.  Allergic Rhinitis: Patient reports good compliance with medication, good tolerance to medication, and good symptom control.    Constipation: Patient reports good compliance with medication, good tolerance to medication, and good symptom control.    Linda; every 6-12 weeks.  Depending on how things are going.  B: cereal Captain crunch, milk Snack: none Lunch:  [place]; sandwich, granola bar, yogurt, soup, banana, crackers, water Snack: crackers Supper:  Spaghetti and chili, ice cream   Visual Acuity Screening   Right eye Left eye Both eyes  Without correction:     With correction: 20/20 20/20 20/20     BP Readings from Last 3 Encounters:  03/09/17 116/72  12/04/16 104/70  06/28/16 104/70   Wt Readings from Last 3 Encounters:  03/09/17 195 lb (88.5 kg)  12/04/16 188 lb 6 oz (85.4 kg)  06/28/16 187 lb (84.8 kg)   Immunization History  Administered Date(s) Administered  . HPV Quadrivalent 03/27/2011  . Influenza Split 10/23/2011  . Influenza,inj,Quad PF,6+ Mos 10/29/2010, 11/10/2013, 11/06/2014, 09/01/2015, 10/07/2016  . Influenza-Unspecified 09/16/2012  . Tdap 01/25/2011   Health Maintenance  Topic Date Due  . PAP SMEAR  03/21/2019  . TETANUS/TDAP  01/24/2021  . INFLUENZA VACCINE  Completed  . HIV Screening  Completed    Review of Systems  Constitutional: Negative for activity change, appetite change, chills, diaphoresis, fatigue, fever and unexpected weight change.  HENT: Negative for congestion, dental problem, drooling, ear discharge, ear pain, facial swelling, hearing loss, mouth sores, nosebleeds, postnasal drip,  rhinorrhea, sinus pressure, sneezing, sore throat, tinnitus, trouble swallowing and voice change.   Eyes: Negative for photophobia, pain, discharge, redness, itching and visual disturbance.  Respiratory: Negative for apnea, cough, choking, chest tightness, shortness of breath, wheezing and stridor.   Cardiovascular: Negative for chest pain, palpitations and leg swelling.  Gastrointestinal: Negative for abdominal distention, abdominal pain, anal bleeding, blood in stool, constipation, diarrhea, nausea, rectal pain and vomiting.  Endocrine: Negative for cold intolerance, heat intolerance, polydipsia, polyphagia and polyuria.  Genitourinary: Negative for decreased urine volume, difficulty urinating, dyspareunia, dysuria, enuresis, flank pain, frequency, genital sores, hematuria, menstrual problem, pelvic pain, urgency, vaginal bleeding, vaginal discharge and vaginal pain.       Nocturia x 0.  No urinary incontinence.  Musculoskeletal: Negative for arthralgias, back pain, gait problem, joint swelling, myalgias, neck pain and neck stiffness.  Skin: Positive for rash. Negative for color change, pallor and wound.  Allergic/Immunologic: Negative for environmental allergies, food allergies and immunocompromised state.  Neurological: Negative for dizziness, tremors, seizures, syncope, facial asymmetry, speech difficulty, weakness, light-headedness, numbness and headaches.  Hematological: Negative for adenopathy. Does not bruise/bleed easily.  Psychiatric/Behavioral: Negative for agitation, behavioral problems, confusion, decreased concentration, dysphoric mood, hallucinations, self-injury, sleep disturbance and suicidal ideas. The patient is not nervous/anxious and is not hyperactive.        Bedtime 830pm; wakes up 700.    Past Medical History:  Diagnosis Date  . ADD (attention deficit disorder)   . ADHD (attention deficit hyperactivity disorder)   . Allergy   . Attention deficit disorder   . Congenital  abnormalities   .  Constipation   . 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 Hearing loss   . Primary ovarian insufficiency   . Screening for mental disease/developmental disorder    Past Surgical History:  Procedure Laterality Date  . BACK SURGERY     scoliosis; teenage   No Known Allergies Current Outpatient Medications on File Prior to Visit  Medication Sig Dispense Refill  . calcium-vitamin D (OSCAL WITH D) 500-200 MG-UNIT tablet Take 1 tablet by mouth 2 (two) times daily. 180 tablet 3  . CRANBERRY EXTRACT PO Take by mouth 2 (two) times daily.    [name] Kitchen escitalopram (LEXAPRO) 20 MG tablet TK 1 T PO QD  5  . guanFACINE (INTUNIV) 1 MG TB24 Take by mouth daily.    [name] Kitchen lisdexamfetamine (VYVANSE) 40 MG capsule Take 40 mg by mouth every morning. Reported on 01/17/2015    . LORazepam (ATIVAN) 1 MG tablet Take 1 mg by mouth daily as needed for anxiety.    . Multiple Vitamins-Minerals (MULTIVITAMIN PO) Take by mouth 2 (two) times daily.    . norethindrone-ethinyl estradiol-iron (MICROGESTIN FE,GILDESS FE,LOESTRIN FE) 1.5-30 MG-MCG tablet Take 1 tablet by mouth daily.    . polyethylene glycol powder (GLYCOLAX/MIRALAX) powder TAKE 16 GRAMS BY MOUTH DAILY 527 g 1  . Probiotic Product (PROBIOTIC DAILY PO) Take by mouth.    . risperiDONE (RISPERDAL) 1 MG tablet Take 1 mg by mouth daily.  5   No current facility-administered medications on file prior to visit.    Social History   Socioeconomic History  . Marital status: Single    Spouse name: Not on file  . Number of children: 0  . Years of education: 66  . Highest education level: High school graduate  Occupational History  . Occupation: disability    Comment: works at [employer]  . Financial resource strain: Not hard at all  . Food insecurity:    Worry: Never true    Inability: Never true  . Transportation needs:    Medical: No     Non-medical: No  Tobacco Use  . Smoking status: Never Smoker  . Smokeless tobacco: Never Used  Substance and Sexual Activity  . Alcohol use: No  . Drug use: No  . Sexual activity: Never    Birth control/protection: None  Lifestyle  . Physical activity:    Days per week: 0 days    Minutes per session: 0 min  . Stress: Not at all  Relationships  . Social connections:    Talks on phone: More than three times a week    Gets together: More than three times a week    Attends religious service: More than 4 times per year    Active member of club or organization: No    Attends meetings of clubs or organizations: Never    Relationship status: Never married  . Intimate partner violence:    Fear of current or ex partner: No    Emotionally abused: No    Physically abused: No    Forced sexual activity: No  Other Topics Concern  . Not on file  Social History Narrative   Marital status: single; not dating in 2019.  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      Lives: Patient lives with [facility]/[name], [name] (28yo) and [name]/dog.     Education: Patient has 12th grade education. Attends [employer] Monday through Friday 10:00-4:00.      Employment:  Disability.      Education: attends [employer]; M-F 10-4      Tobacco: none      Alcohol: none      Exercise: walking on weekends; some exercise at day program.  Bowls once per week.     Right handed.   Caffeine- Couple times a week.- One   Family History  Problem Relation Age of Onset  . Seizures Mother   . Hypothyroidism Mother   . Diabetes Father   . Heart disease Father 80       AMI as cause of death  . COPD Father   . Seizures Sister   . Mental retardation Sister        Objective:    BP 116/72   Pulse 100   Temp 99 F (37.2 C) (Oral)   Resp 16   Ht 5' 3.19" (1.605 m)   Wt 195 lb (88.5 kg)   SpO2 99%   BMI 34.34 kg/m  Physical Exam    Constitutional: She is oriented to person, place, and time. She appears well-developed and well-nourished. No distress.  HENT:  Head: Normocephalic and atraumatic.  Right Ear: Hearing, tympanic membrane, external ear and ear canal normal.  Left Ear: Hearing, tympanic membrane, external ear and ear canal normal.  Nose: Nose normal.  Mouth/Throat: Oropharynx is clear and moist.  Eyes: Conjunctivae and EOM are normal. Pupils are equal, round, and reactive to light.  Neck: Normal range of motion and full passive range of motion without pain. Neck supple. No JVD present. Carotid bruit is not present. No thyromegaly present.  Cardiovascular: Normal rate, regular rhythm, normal heart sounds and intact distal pulses. Exam reveals no gallop and no friction rub.  No murmur heard. Pulmonary/Chest: Effort normal and breath sounds normal. No respiratory distress. She has no wheezes. She has no rales. Right breast exhibits no inverted nipple, no mass, no nipple discharge, no skin change and no tenderness. Left breast exhibits no inverted nipple, no mass, no nipple discharge, no skin change and no tenderness. Breasts are symmetrical.  Abdominal: Soft. Bowel sounds are normal. She exhibits no distension and no mass. There is no tenderness. There is no rebound and no guarding.  Musculoskeletal:       Right shoulder: Normal.       Left shoulder: Normal.       Cervical back: Normal.  Lymphadenopathy:    She has no cervical adenopathy.  Neurological: She is alert and oriented to person, place, and time. She has normal reflexes. No cranial nerve deficit. She exhibits normal muscle tone. Coordination normal.  Skin: Skin is warm and dry. No rash noted. She is not diaphoretic. No erythema. No pallor.  Psychiatric: She has a normal mood and affect. Her behavior is normal. Judgment and thought content normal.  Nursing note and vitals reviewed.  No results found. Depression screen [facility] 2/9 03/09/2017 12/04/2016  06/28/2016 02/25/2016 11/23/2015  Decreased Interest 0 0 0 0 0  Down, Depressed, Hopeless 0 0 0 0 0  PHQ - 2 Score 0 0 0 0 0   Fall Risk  03/09/2017 12/04/2016 06/28/2016 02/25/2016 11/23/2015  Falls in the past year? No No Yes No Yes  Number falls in past yr: - - 1 - 1  Injury with Fall? - - No - -        Assessment & Plan:   1. Seasonal allergic rhinitis  due to pollen   2. Hyperprolactinemia ([place])   3. Attention deficit hyperactivity disorder (ADHD), combined type   4. Seizures (HCC)   5. Class 1 obesity due to excess calories without serious comorbidity with body mass index (BMI) of 32.0 to 32.9 in adult   6. Pure hypercholesterolemia   7. Mental developmental delay   8. Thyroid function study abnormality    -anticipatory guidance provided --- exercise, weight loss. -obtain age appropriate screening labs and labs for chronic disease management. -Stable chronic medical conditions.  Continue current medications. -Recent abnormal TSH; repeat today.   --recommend weight loss, exercise for 30-60 minutes five days per week; recommend 1200 kcal restriction per day with a minimum of 60 grams of protein per day. -followed by psychiatry for mental developmental delay and aDHD. -followed by Dr. Delsa Sale for hyperprolactinemia and fetal valproate syndrome.   Orders Placed This Encounter  Procedures  . CBC with Differential/Platelet  . Comprehensive metabolic panel    Order Specific Question:   Has the patient fasted?    Answer:   No  . TSH  . POCT urinalysis dipstick   Meds ordered this encounter  Medications  . fluticasone (FLONASE) 50 MCG/ACT nasal spray    Sig: Place 2 sprays into both nostrils daily.    Dispense:  48 g    Refill:  3  . loratadine (CLARITIN) 10 MG tablet    Sig: Take 1 tablet (10 mg total) by mouth daily.    Dispense:  90 tablet    Refill:  3    Return in about 1 year (around 03/10/2018) for complete physical examiniation.   Lewi Drost Elayne Guerin,  M.D. Primary Care at [facility] previously [facility] [address] [place], [place]  [address] [phone] phone [phone] fax

## 2017-03-10 LAB — CBC WITH DIFFERENTIAL/PLATELET
BASOS ABS: 0 10*3/uL (ref 0.0–0.2)
BASOS: 0 %
EOS (ABSOLUTE): 0.1 10*3/uL (ref 0.0–0.4)
Eos: 2 %
Hematocrit: 40.9 % (ref 34.0–46.6)
Hemoglobin: 13.5 g/dL (ref 11.1–15.9)
Immature Grans (Abs): 0 10*3/uL (ref 0.0–0.1)
Immature Granulocytes: 0 %
Lymphocytes Absolute: 2.2 10*3/uL (ref 0.7–3.1)
Lymphs: 30 %
MCH: 31 pg (ref 26.6–33.0)
MCHC: 33 g/dL (ref 31.5–35.7)
MCV: 94 fL (ref 79–97)
MONOS ABS: 0.4 10*3/uL (ref 0.1–0.9)
Monocytes: 5 %
NEUTROS ABS: 4.6 10*3/uL (ref 1.4–7.0)
NEUTROS PCT: 63 %
PLATELETS: 290 10*3/uL (ref 150–379)
RBC: 4.36 x10E6/uL (ref 3.77–5.28)
RDW: 13 % (ref 12.3–15.4)
WBC: 7.3 10*3/uL (ref 3.4–10.8)

## 2017-03-10 LAB — COMPREHENSIVE METABOLIC PANEL
A/G RATIO: 1.4 (ref 1.2–2.2)
ALBUMIN: 4.1 g/dL (ref 3.5–5.5)
ALT: 36 IU/L — ABNORMAL HIGH (ref 0–32)
AST: 21 IU/L (ref 0–40)
Alkaline Phosphatase: 83 IU/L (ref 39–117)
BUN/Creatinine Ratio: 13 (ref 9–23)
BUN: 10 mg/dL (ref 6–20)
Bilirubin Total: <0.2 mg/dL (ref 0.0–1.2)
CALCIUM: 9.6 mg/dL (ref 8.7–10.2)
CO2: 24 mmol/L (ref 20–29)
Chloride: 106 mmol/L (ref 96–106)
Creatinine, Ser: 0.8 mg/dL (ref 0.57–1.00)
GFR, EST AFRICAN AMERICAN: 117 mL/min/{1.73_m2} (ref >59)
GFR, EST NON AFRICAN AMERICAN: 101 mL/min/{1.73_m2} (ref >59)
GLOBULIN, TOTAL: 3 g/dL (ref 1.5–4.5)
Glucose: 94 mg/dL (ref 65–99)
POTASSIUM: 4.6 mmol/L (ref 3.5–5.2)
SODIUM: 146 mmol/L — AB (ref 134–144)
TOTAL PROTEIN: 7.1 g/dL (ref 6.0–8.5)

## 2017-03-10 LAB — TSH: TSH: 4 u[IU]/mL (ref 0.450–4.500)

## 2017-03-19 DIAGNOSIS — N910 Primary amenorrhea: Secondary | ICD-10-CM | POA: Diagnosis not present

## 2017-03-19 DIAGNOSIS — Q8789 Other specified congenital malformation syndromes, not elsewhere classified: Secondary | ICD-10-CM | POA: Diagnosis not present

## 2017-03-19 DIAGNOSIS — Z01411 Encounter for gynecological examination (general) (routine) with abnormal findings: Secondary | ICD-10-CM | POA: Diagnosis not present

## 2017-04-01 MED ORDER — FLUTICASONE PROPIONATE 50 MCG/ACT NA SUSP: 2.0000 | Freq: Every day | NASAL | 3 refills | Status: DC

## 2017-04-01 MED ORDER — LORATADINE 10 MG PO TABS: 10.0000 mg | ORAL_TABLET | Freq: Every day | ORAL | 3 refills | Status: AC

## 2017-05-01 DIAGNOSIS — F901 Attention-deficit hyperactivity disorder, predominantly hyperactive type: Secondary | ICD-10-CM | POA: Diagnosis not present

## 2017-05-01 DIAGNOSIS — F900 Attention-deficit hyperactivity disorder, predominantly inattentive type: Secondary | ICD-10-CM | POA: Diagnosis not present

## 2017-05-01 DIAGNOSIS — F639 Impulse disorder, unspecified: Secondary | ICD-10-CM | POA: Diagnosis not present

## 2017-05-01 DIAGNOSIS — F840 Autistic disorder: Secondary | ICD-10-CM | POA: Diagnosis not present

## 2017-05-01 DIAGNOSIS — F70 Mild intellectual disabilities: Secondary | ICD-10-CM | POA: Diagnosis not present

## 2017-05-01 DIAGNOSIS — F349 Persistent mood [affective] disorder, unspecified: Secondary | ICD-10-CM | POA: Diagnosis not present

## 2017-05-18 ENCOUNTER — Encounter: Payer: Self-pay | Admitting: Family Medicine

## 2017-05-23 ENCOUNTER — Encounter: Payer: Self-pay | Admitting: Family Medicine

## 2017-07-04 DIAGNOSIS — F332 Major depressive disorder, recurrent severe without psychotic features: Secondary | ICD-10-CM | POA: Diagnosis not present

## 2017-07-09 ENCOUNTER — Encounter: Payer: Self-pay | Admitting: Family Medicine

## 2017-07-10 DIAGNOSIS — F840 Autistic disorder: Secondary | ICD-10-CM | POA: Diagnosis not present

## 2017-07-10 DIAGNOSIS — F349 Persistent mood [affective] disorder, unspecified: Secondary | ICD-10-CM | POA: Diagnosis not present

## 2017-07-10 DIAGNOSIS — F411 Generalized anxiety disorder: Secondary | ICD-10-CM | POA: Diagnosis not present

## 2017-07-10 DIAGNOSIS — F901 Attention-deficit hyperactivity disorder, predominantly hyperactive type: Secondary | ICD-10-CM | POA: Diagnosis not present

## 2017-07-10 DIAGNOSIS — F70 Mild intellectual disabilities: Secondary | ICD-10-CM | POA: Diagnosis not present

## 2017-08-10 ENCOUNTER — Encounter: Payer: Self-pay | Admitting: Family Medicine

## 2017-08-10 ENCOUNTER — Ambulatory Visit (INDEPENDENT_AMBULATORY_CARE_PROVIDER_SITE_OTHER): Payer: Medicare Other | Admitting: Family Medicine

## 2017-08-10 ENCOUNTER — Other Ambulatory Visit: Payer: Self-pay

## 2017-08-10 VITALS — BP 111/76 | HR 83 | Temp 98.3°F | Resp 16 | Ht 62.0 in | Wt 201.2 lb

## 2017-08-10 DIAGNOSIS — H6123 Impacted cerumen, bilateral: Secondary | ICD-10-CM | POA: Diagnosis not present

## 2017-08-10 DIAGNOSIS — B353 Tinea pedis: Secondary | ICD-10-CM | POA: Diagnosis not present

## 2017-08-10 DIAGNOSIS — F902 Attention-deficit hyperactivity disorder, combined type: Secondary | ICD-10-CM

## 2017-08-10 DIAGNOSIS — R3589 Other polyuria: Secondary | ICD-10-CM

## 2017-08-10 DIAGNOSIS — R358 Other polyuria: Secondary | ICD-10-CM

## 2017-08-10 DIAGNOSIS — Z6834 Body mass index (BMI) 34.0-34.9, adult: Secondary | ICD-10-CM | POA: Diagnosis not present

## 2017-08-10 DIAGNOSIS — E6609 Other obesity due to excess calories: Secondary | ICD-10-CM | POA: Diagnosis not present

## 2017-08-10 DIAGNOSIS — R635 Abnormal weight gain: Secondary | ICD-10-CM

## 2017-08-10 DIAGNOSIS — Z8639 Personal history of other endocrine, nutritional and metabolic disease: Secondary | ICD-10-CM

## 2017-08-10 DIAGNOSIS — E7800 Pure hypercholesterolemia, unspecified: Secondary | ICD-10-CM | POA: Diagnosis not present

## 2017-08-10 DIAGNOSIS — E2839 Other primary ovarian failure: Secondary | ICD-10-CM

## 2017-08-10 DIAGNOSIS — E221 Hyperprolactinemia: Secondary | ICD-10-CM | POA: Diagnosis not present

## 2017-08-10 DIAGNOSIS — E66811 Obesity, class 1: Secondary | ICD-10-CM

## 2017-08-10 DIAGNOSIS — R631 Polydipsia: Secondary | ICD-10-CM

## 2017-08-10 DIAGNOSIS — R632 Polyphagia: Secondary | ICD-10-CM | POA: Diagnosis not present

## 2017-08-10 LAB — POCT URINALYSIS DIP (MANUAL ENTRY)
BILIRUBIN UA: NEGATIVE
GLUCOSE UA: NEGATIVE mg/dL
Ketones, POC UA: NEGATIVE mg/dL
NITRITE UA: NEGATIVE
RBC UA: NEGATIVE
Spec Grav, UA: 1.02 (ref 1.010–1.025)
Urobilinogen, UA: 0.2 E.U./dL
pH, UA: 7 (ref 5.0–8.0)

## 2017-08-10 MED ORDER — KETOCONAZOLE 2 % EX CREA: 1.0000 "application " | TOPICAL_CREAM | Freq: Every day | CUTANEOUS | 2 refills | Status: DC

## 2017-08-10 NOTE — Progress Notes (Signed)
Subjective:    Patient ID: Cheryl Dominguez, female    DOB: Jun 28, 1990, 28 y.o.   MRN: [id number] Chief Complaint  Patient presents with  . Weight mgmt    HPI  Seen here today with Cheryl Dominguez her guardian.  Recently Cheryl Dominguez has started living with a new caregiver during the week who has a preteen daughter.  Cheryl Dominguez gets along very well with the daughter.  However Cheryl Dominguez has been concerned about candies weight gain.  And candies prior living situation she was mainly provided with very healthy food choices plenty of fresh vegetables and salads.  However now she has more access to fast foods, fried foods, and pizza.  Cheryl Dominguez has sent healthy options home with Cheryl Dominguez for at least the first day or 2 following a weekend with her but then Cheryl Dominguez is forced to choose to eat the tupperware of salad she brought with her rather than the hot pizza that everyone else in the house is eating. However due to candies internal endocrine disorders, Cheryl Dominguez wants to be very proactive about weight management to ensure that Cheryl Dominguez is always able to be easily active, cared for and have significantly less medical problems for herself and caregivers. Cheryl Dominguez does not get much exercise.  She occasionally will go for a walk with her caregiver.  On weekends with Cheryl Dominguez they are much more active.  Has chronic recurrent itchy dry skin on feet.  Cannot get athlete's foot to go away.  Cheryl Dominguez complains of urinary frequency and feeling hungry and thirsty a lot. Past Medical History:  Diagnosis Date  . ADD (attention deficit disorder)   . ADHD (attention deficit hyperactivity disorder)   . Allergy   . Attention deficit disorder   . Congenital abnormalities   . Constipation   . 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 Hearing loss   . Primary ovarian insufficiency   . Screening for mental disease/developmental disorder    Past Surgical History:    Procedure Laterality Date  . BACK SURGERY     scoliosis; teenage   Current Outpatient Medications on File Prior to Visit  Medication Sig Dispense Refill  . calcium-vitamin D (OSCAL WITH D) 500-200 MG-UNIT tablet Take 1 tablet by mouth 2 (two) times daily. 180 tablet 3  . CRANBERRY EXTRACT PO Take by mouth 2 (two) times daily.    [name] Kitchen escitalopram (LEXAPRO) 20 MG tablet TK 1 T PO QD  5  . fluticasone (FLONASE) 50 MCG/ACT nasal spray Place 2 sprays into both nostrils daily. 48 g 3  . guanFACINE (INTUNIV) 1 MG TB24 Take by mouth daily.    [name] Kitchen lisdexamfetamine (VYVANSE) 40 MG capsule Take 40 mg by mouth every morning. Reported on 01/17/2015    . loratadine (CLARITIN) 10 MG tablet Take 1 tablet (10 mg total) by mouth daily. 90 tablet 3  . LORazepam (ATIVAN) 1 MG tablet Take 1 mg by mouth daily as needed for anxiety.    . Multiple Vitamins-Minerals (MULTIVITAMIN PO) Take by mouth 2 (two) times daily.    . norethindrone-ethinyl estradiol-iron (MICROGESTIN FE,GILDESS FE,LOESTRIN FE) 1.5-30 MG-MCG tablet Take 1 tablet by mouth daily.    . polyethylene glycol powder (GLYCOLAX/MIRALAX) powder TAKE 16 GRAMS BY MOUTH DAILY 527 g 1  . Probiotic Product (PROBIOTIC DAILY PO) Take by mouth.    . risperiDONE (RISPERDAL) 1 MG tablet Take 1 mg by mouth daily.  5   No current  facility-administered medications on file prior to visit.    No Known Allergies Family History  Problem Relation Age of Onset  . Seizures Mother   . Hypothyroidism Mother   . Diabetes Father   . Heart disease Father 79       AMI as cause of death  . COPD Father   . Seizures Sister   . Mental retardation Sister    Social History   Socioeconomic History  . Marital status: Single    Spouse name: Not on file  . Number of children: 0  . Years of education: 56  . Highest education level: High school graduate  Occupational History  . Occupation: disability    Comment: works at [employer]  . Financial resource  strain: Not hard at all  . Food insecurity:    Worry: Never true    Inability: Never true  . Transportation needs:    Medical: No    Non-medical: No  Tobacco Use  . Smoking status: Never Smoker  . Smokeless tobacco: Never Used  Substance and Sexual Activity  . Alcohol use: No  . Drug use: No  . Sexual activity: Never    Birth control/protection: None  Lifestyle  . Physical activity:    Days per week: 0 days    Minutes per session: 0 min  . Stress: Not at all  Relationships  . Social connections:    Talks on phone: More than three times a week    Gets together: More than three times a week    Attends religious service: More than 4 times per year    Active member of club or organization: No    Attends meetings of clubs or organizations: Never    Relationship status: Never married  Other Topics Concern  . Not on file  Social History Narrative   Marital status: single; not dating in 2019.  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      Lives: Patient lives with [facility]/[name], [name] (28yo) and [name]/dog.     Education: Patient has 12th grade education. Attends [employer] Monday through Friday 10:00-4:00.      Employment:  Disability.      Education: attends [employer]; M-F 10-4      Tobacco: none      Alcohol: none      Exercise: walking on weekends; some exercise at day program.  Bowls once per week.     Right handed.   Caffeine- Couple times a week.- One   Depression screen [facility] 2/9 09/06/2017 08/10/2017 03/09/2017 12/04/2016 06/28/2016  Decreased Interest 0 0 0 0 0  Down, Depressed, Hopeless 0 0 0 0 0  PHQ - 2 Score 0 0 0 0 0     Review of Systems  Constitutional: Positive for appetite change and unexpected weight change. Negative for activity change, chills, diaphoresis, fatigue and fever.  HENT: Positive for ear discharge and ear pain.   Eyes: Negative for visual disturbance.  Respiratory: Negative  for cough and shortness of breath.   Cardiovascular: Negative for chest pain, palpitations and leg swelling.  Endocrine: Positive for polydipsia, polyphagia and polyuria.  Genitourinary: Positive for frequency and urgency. Negative for decreased urine volume and dysuria.  Skin: Positive for rash.  Allergic/Immunologic: Negative for immunocompromised state.  Neurological: Negative for syncope and headaches.  Hematological: Does not bruise/bleed easily.  Psychiatric/Behavioral: Positive for behavioral problems, confusion and decreased concentration. Negative for agitation and  dysphoric mood.   See hpi    Objective:   Physical Exam  Constitutional: She is oriented to person, place, and time. She appears well-developed and well-nourished. No distress.  HENT:  Head: Normocephalic and atraumatic.  Right Ear: External ear normal.  Left Ear: External ear normal.  Eyes: Conjunctivae are normal. No scleral icterus.  Neck: Normal range of motion. Neck supple. No thyromegaly present.  Cardiovascular: Normal rate, regular rhythm, normal heart sounds and intact distal pulses.  Pulmonary/Chest: Effort normal and breath sounds normal. No respiratory distress.  Musculoskeletal: She exhibits no edema.  Lymphadenopathy:    She has no cervical adenopathy.  Neurological: She is alert and oriented to person, place, and time.  Skin: Skin is warm and dry. She is not diaphoretic. No erythema.  Psychiatric: She has a normal mood and affect. Her behavior is normal.  BP 111/76 (BP Location: Right Arm, Patient Position: Sitting, Cuff Size: Large)   Pulse 83   Temp 98.3 F (36.8 C) (Oral)   Resp 16   Ht 5\' 2"  (1.575 m)   Wt 201 lb 3.2 oz (91.3 kg)   LMP 07/08/2017   SpO2 98%   BMI 36.80 kg/m      Assessment & Plan:  Could not get b12 or D covered, not fasting   1. Class 1 obesity due to excess calories without serious comorbidity with body mass index (BMI) of 34.0 to 34.9 in adult   2. Primary  ovarian insufficiency   3. Hyperprolactinemia ([place])   4. Attention deficit hyperactivity disorder (ADHD), combined type   5. Pure hypercholesterolemia   6. Polyphagia   7. Polyuria   8. Polydipsia   9. Excessive body weight gain   10. Bilateral impacted cerumen   11. Tinea pedis of both feet   12. Personal history of endocrine disorder    Patient now in a new living situation and caregiver concerned about weight gain.  Unfortunately caregiver is not present for discussion and patient is highly likely to be most dependent and influenced by main caregivers dietary options and lifestyle activities as far as exercise.  Highly recommend that patient attend a weight loss or nutrition course accompanied by her main weekly caregiver since her guardian Cheryl Dominguez clearly has an excellent knowledge base but only some weekends to make food choices. Tried to troubleshoot different ways that patient could easily prepare herself healthy choices if her caregiver is offering unhealthy.  Discussed ways to differentiate between healthy and unhealthy options. Orders Placed This Encounter  Procedures  . Hemoglobin A1c  . TSH+T4F+T3Free  . Insulin, random  . CBC  . Comprehensive metabolic panel  . TSH+T4F+T3Free  . Hemoglobin A1c  . Amb ref to Medical Nutrition Therapy-MNT    Referral Priority:   Routine    Referral Type:   Consultation    Referral Reason:   Specialty Services Required    Requested Specialty:   Nutrition    Number of Visits Requested:   1  . POCT urinalysis dipstick    Meds ordered this encounter  Medications  . ketoconazole (NIZORAL) 2 % cream    Sig: Apply 1 application topically daily. X 4 weeks then as needed    Dispense:  60 g    Refill:  2   Over 40 min spent in face-to-face evaluation of and consultation with patient and coordination of care.  Over 50% of this time was spent counseling this patient regarding diet/exercise  [name], MD, MPH Primary Care at [facility]  [address] [place], [place]  [address] [phone] Office phone  [phone] Office fax   09/27/17 3:24 PM

## 2017-08-10 NOTE — Patient Instructions (Addendum)
IF you received an x-ray today, you will receive an invoice from [facility]. Please contact [facility] at [phone] with questions or concerns regarding your invoice.   IF you received labwork today, you will receive an invoice from [place]. Please contact [facility] at [phone] with questions or concerns regarding your invoice.   Our billing staff will not be able to assist you with questions regarding bills from these companies.  You will be contacted with the lab results as soon as they are available. The fastest way to get your results is to activate your My Chart account. Instructions are located on the last page of this paperwork. If you have not heard from [place] regarding the results in 2 weeks, please contact this office.    Weight Loss Diet  Restaurants  Eating out at restaurants has become a way of life for most of [place].  On an average basis, Americans eat more than 25% of their meals away from home.  When eating a meal at a restaurant, it is important to plan and think ahead about what healthy food choices are available. In addition, it is important to think that every meal out is a special occasion and to practice portion control.  Most restaurants serve portions that are 2-3 times bigger than what is considered normal.  There are several ways to control these portion sizes:  Share the meal with another person, have half of the meal bagged "to go" before eating, or eat half and leave the rest behind.  If the bread basket is your weakness, ask that it not be served.  Portion sizes also pertain to drinks. Water, diet soda, and unsweetened iced tea are the best calorie free beverages available. Multiple refills of regular soda and alcoholic beverages greatly increase total calorie intake.   The following items offer smart, low calore choice when eating at a restaurant as well as the appropriate serving sizes:  Fast Food: Fried chicken sandwich with lettuce and  tomato, mayonnaise, cheese and [place] fries should be avoided.  Many salads at fast food restaurants have more calories and fat than a hamburger.  Make sure salad has grilled meat only with no cheese  Or nuts.  Fat free or light dressings should always be used.  [place] Food: 1 cup egg drop soup or 1 cup of Chinese vegetables with shrimp or tofin.  Avoid all fried food, including fried rice.  One half to 1 cup steamed white rice is also acceptable.   [place] Food: 1 small bean burrito or 2 chicken fajitas without cheese, sour cream and guacamole.  Fat free or light sour cream is allowed.  [place] Food: Spaghetti with [facility] (1 cup spaghetti, 1/2 cup sauce).  Cram sauces (alfredo) and fried foods (chicken, eggplant, veal parmesan) should be avoided.  [place] Food: Sushi and teriyaki fish or chicken are healthy options served with steamed vegetables.  Brunch Buffet: 1 cup fruit salad, 1 cup oatmeal, 2 pancakes with light syrup, 1/2 cup scrambled eggs, toast with jelly.  Biscuits and gravy, bacon and sausage should be avoided.  [place] bacon is acceptable.  Commitment- Commitment is a critical part of any successful project and is the difference between failure and success.  It involves focusing on your goal seriously and using will power to get through the rough times.  This allows you to discover inner strengths and develop a confident and an in control new you.   Dieting is not an easy task and  there are bound to be rough times.  However, if your commitment to yourself and your weight loss is strong, you will be successful in achieving your goal.  Don't break your promise to yourself.    Diet Modification:  The main components of a healthy diet include 3 small meals with 3 healthy snakes throughout each day.  Your should not go more than 4 hours without eating a small snack or meal.  This helps avoid "starvation" and decreased the urge to choose unhealthy foods. The following guidelines  should be followed through the day:  Focus on portion size and control   It is important to read nutrition labels so that you are aware of the appropriate serving size  For those foods that do not have a nutrition label, you can generally use your had as a guide for the appropriate servings sizes.  Fist 1 cup or medium whole fruit  Thumb (tip to base) 1 ounce of meat or chees  Thumb tip (tip to 1st joint) 1 tablespoon  Fingertip (tip to 1st joint) 1 teaspoon  Cupped hand 1-2 ounces of nuts, pretzels, popcorn  Palm (minus fingers) 3 ounces of meat, fish or poultry    Eat breakfast daily to increase metabolism and prevent loss of control later in the day.  Research has repeatedly shown that patients who eat breakfast daily lose more weight and keep the weight off more effectively than patients who skip breakfast.  One serving of high fiber cereal (> 3 grams of fiber) with 1 cup of skim milk.  One serving of old fashioned oatmeal ( not instant) made with skim milk.  May add 1/2 cup chopped fruit an sugar substitute for added flavor.  Two egg omelet made with egg beaters. May add 1 tablespoon reduced fat cheese and unlimited vegetables.  One slice whole grain toast with 1 teaspoon Smart Beat margarine.  Avoid fruit juice since it has too many sugars and too little fiber. Coffee and tea fine with sugar substitute and skim or fat free dairy creamer.  Mid morning snack-choosing one of the following will help keep the craving under control.  One piece of fruit-apple, pear, banana, 1 cup of grapes  One half cup 2% cottage cheese  One container of fat free yogurt  One low fat mozzarella cheese stick  1 teaspoon natural peanut butter on celery sticks  1-2 cups air popped popcorn sprayed no more than twice with fat free margarine spray (May only have one per day)    Lunch  Start with an all vegetable salad ( no cheese or croutons with 1 teaspoon fat free or light dressing salad  dressing.  Many patients find it convenient to eat a prepared meal such as Healthy [employer], [employer], [employer], [employer].  It is important to alternate these prepared meals with homemade meals due to their high salt content.  See dinner options for homemade meal ideas.  May add 1/2 cup rice to meal if prepared meal is not adequate  Piece of fruit.  Mid Afternoon Snack-Same as mid morning snack  Dinner  May pick one serving from each of the following categories (derived from [place] best selling book, :"Body for Live").  Start with an all vegetable salad (no cheese or croutons) with 1 teaspoon of fat free or light salad dressing or 1 cup low fat broth based soup Good Proteins (Palm-size portion 4oz) Good Carbohydrates (Fist-sized portion) Good Vegetables  Baked or grilled chicken breast Baked Potato  Broccoli  [place] Breast Sweet Potato Asparagus  Lean ground [place] [employer]  Fish (baked, broiled, grilled, blackened allowed) [employer] ( 1/2-1 cup) Carrots  Tuna (Fresh or canned in water) Wild rice (1/2-1 cup) Cauliflower  Crab Pasta ( 1 cup whole grain) Green Beans  Shrimp Oatmeal Green Peppers  Top round steak Beans(red, pinto, black) Mushrooms   Top sirloin steak [place] representative ground beef Strawberries Tomato  Low-fat cottage cheese Whole wheat bread (1 slice regular 2 slice light) Artichoke    Cabbage    Zucchini    Cucumber    Onion    Prepare these foods using low-fat techniques such as grilling, baking, broiling, blackening meats and steaming vegetables.  Avoid canned vegetables and fruits to decrease sodium intake.  Fresh and froze produce offer the most nutritional value  Evening Snack or "Dessert"  1/2 cup skim milk with 1 package of No sugar added hot cocoa mix  1 serving of Weight Watchers frozen desserts, Fudgesicle bar, Skinny cow ice cream treats  1 cup Sugar Free Jell-O or instant pudding topped with 1 teaspoon fat free whipped  topping    1-2 cups air popped popcorn sprayed no more than twice with Fat Free Margarine Spray (may only have once per day)  Water  Eight glasses of water daily (lemon, sugar substitute, and crystal light accepted)  All regular sodas and sweetened tea are NOT allowed.  This results in taking in too many empty calories.  Two diet sodas per day are allowed as are an unlimited amount of decaffeinated coffee and tea as long as sugar substitute (Splenda, Sweet and Low, Equal) only are used for sweetening.  Water naturally suppresses the appetite and helps the body metabolize stored fat  Drinking enough water is the best treatment for fluid retention (unless you have been diagnosed with kidney deficiency).  When the body gets less water, it senses it as a threat and holds on to the water.  It is stored in places such as the feet, legs and hands.  By drinking plenty of water, the problem of water, the problem of water retention is solved and weight loss occurs.  The above diet modifications are based on the following principles:  25-35 grams of fiber daily (found in vegetables, whole grain, oats, beans and fruits)  Choose healthy carbohydrates that are rich in fiber, vitamins, and minerals (wheat bread vs white bread, wheat pasta vs regular pasta)  Healthy fats: Choose canola or olive oil if you must use oil to cook with.  Pam cooking spray is a good substitute.  Smart Choice margarine is a healthy alternative to other margarine and butter options.  Baked goods and fried foods should be avoided to decrease calorie intake harmful trans fatty acids and saturated fat absorb in the body.  Healthy Proteins: Americans consume far too much red meat (high amount of saturated fat). Healthier sources of protein are baked or broiled fish,poultry (without skin), beans, low-fat cottage cheese, egg whites and egg substitutes.  If you choose red meat, pick lean cuts such as top round, to sirloin or round,  The  following will be implemented to help assist you in achieving successful weight loss on a weekly basis:  Food Diary: Keeping a record of all food you have eaten on a daily basis helps to make you aware of your food choices and helps determine the amount you have eaten.  It also serves as a great reference to look back to  see what you did well on during the weeks you were most successful.  Weekly Meal Plan: Plan your meals and go grocery shopping on a weekly basis.  Those patients that plan their meals are far more successful on reaching their goals than other patients.  Without a plan, it is easy to fall back into old eating habits.  Weekly weigh in: Patients who report weekly for weight checks do better than those that try to diet without help.  Knowing your weight will be checked weekly helps keep you on track as well as staying motivated by the encouragement received on a weekly basis.    Exercise  You will need to begin exercising at least 5 days a week.  If you are just beginning an exercise program, start slowly at 15-20 minutes and gradually work up to a longer period of time.  If you are tolerating the exercise well you can increase by 5 minutes each day.  You may use any form of exercise you wish (walking, aerobics, treadmill, stair stepper, elliptical machine).  If you can walk two miles in 30-40 minutes,  You are at a good pace for a successful start.   Do not over do it!!  Make sure you can carry on a conversation with someone or able to whistle while you exercise.  Once you adjust your pace and are breathing normally, step up your pace as you can tolerate it.  Exercising to Lose Weight Exercising can help you to lose weight. In order to lose weight through exercise, you need to do vigorous-intensity exercise. You can tell that you are exercising with vigorous intensity if you are breathing very hard and fast and cannot hold a conversation while exercising. Moderate-intensity exercise  helps to maintain your current weight. You can tell that you are exercising at a moderate level if you have a higher heart rate and faster breathing, but you are still able to hold a conversation. How often should I exercise? Choose an activity that you enjoy and set realistic goals. Your health care provider can help you to make an activity plan that works for you. Exercise regularly as directed by your health care provider. This may include:  Doing resistance training twice each week, such as: ? Push-ups. ? Sit-ups. ? Lifting weights. ? Using resistance bands.  Doing a given intensity of exercise for a given amount of time. Choose from these options: ? 150 minutes of moderate-intensity exercise every week. ? 75 minutes of vigorous-intensity exercise every week. ? A mix of moderate-intensity and vigorous-intensity exercise every week.  Children, pregnant women, people who are out of shape, people who are overweight, and older adults may need to consult a health care provider for individual recommendations. If you have any sort of medical condition, be sure to consult your health care provider before starting a new exercise program. What are some activities that can help me to lose weight?  Walking at a rate of at least 4.5 miles an hour.  Jogging or running at a rate of 5 miles per hour.  Biking at a rate of at least 10 miles per hour.  Lap swimming.  Roller-skating or in-line skating.  Cross-country skiing.  Vigorous competitive sports, such as football, basketball, and soccer.  Jumping rope.  Aerobic dancing. How can I be more active in my day-to-day activities?  Use the stairs instead of the elevator.  Take a walk during your lunch break.  If you drive, park your car farther away  from work or school.  If you take public transportation, get off one stop early and walk the rest of the way.  Make all of your phone calls while standing up and walking around.  Get up,  stretch, and walk around every 30 minutes throughout the day. What guidelines should I follow while exercising?  Do not exercise so much that you hurt yourself, feel dizzy, or get very short of breath.  Consult your health care provider prior to starting a new exercise program.  Wear comfortable clothes and shoes with good support.  Drink plenty of water while you exercise to prevent dehydration or heat stroke. Body water is lost during exercise and must be replaced.  Work out until you breathe faster and your heart beats faster. This information is not intended to replace advice given to you by your health care provider. Make sure you discuss any questions you have with your health care provider. Document Released: 01/21/2010 Document Revised: 05/27/2015 Document Reviewed: 05/22/2013 Elsevier Interactive Patient Education  [employer].

## 2017-08-11 LAB — CBC
HEMATOCRIT: 42.9 % (ref 34.0–46.6)
Hemoglobin: 13.4 g/dL (ref 11.1–15.9)
MCH: 29.5 pg (ref 26.6–33.0)
MCHC: 31.2 g/dL — ABNORMAL LOW (ref 31.5–35.7)
MCV: 94 fL (ref 79–97)
PLATELETS: 323 10*3/uL (ref 150–450)
RBC: 4.55 x10E6/uL (ref 3.77–5.28)
RDW: 13.2 % (ref 12.3–15.4)
WBC: 6.8 10*3/uL (ref 3.4–10.8)

## 2017-08-11 LAB — COMPREHENSIVE METABOLIC PANEL
ALT: 60 IU/L — AB (ref 0–32)
AST: 30 IU/L (ref 0–40)
Albumin/Globulin Ratio: 1.5 (ref 1.2–2.2)
Albumin: 4 g/dL (ref 3.5–5.5)
Alkaline Phosphatase: 79 IU/L (ref 39–117)
BUN/Creatinine Ratio: 11 (ref 9–23)
BUN: 8 mg/dL (ref 6–20)
CHLORIDE: 105 mmol/L (ref 96–106)
CO2: 22 mmol/L (ref 20–29)
Calcium: 8.9 mg/dL (ref 8.7–10.2)
Creatinine, Ser: 0.7 mg/dL (ref 0.57–1.00)
GFR, EST AFRICAN AMERICAN: 136 mL/min/{1.73_m2} (ref >59)
GFR, EST NON AFRICAN AMERICAN: 118 mL/min/{1.73_m2} (ref >59)
GLOBULIN, TOTAL: 2.6 g/dL (ref 1.5–4.5)
Glucose: 102 mg/dL — ABNORMAL HIGH (ref 65–99)
Potassium: 4.3 mmol/L (ref 3.5–5.2)
SODIUM: 141 mmol/L (ref 134–144)
TOTAL PROTEIN: 6.6 g/dL (ref 6.0–8.5)

## 2017-08-11 LAB — INSULIN, RANDOM: INSULIN: 27 u[IU]/mL — ABNORMAL HIGH (ref 2.6–24.9)

## 2017-08-11 LAB — TSH+T4F+T3FREE
FREE T4: 1.18 ng/dL (ref 0.82–1.77)
T3, Free: 3.2 pg/mL (ref 2.0–4.4)
TSH: 3.69 u[IU]/mL (ref 0.450–4.500)

## 2017-08-11 LAB — HEMOGLOBIN A1C
Est. average glucose Bld gHb Est-mCnc: 103 mg/dL
Hgb A1c MFr Bld: 5.2 % (ref 4.8–5.6)

## 2017-09-06 ENCOUNTER — Encounter: Payer: Self-pay | Admitting: Skilled Nursing Facility1

## 2017-09-06 ENCOUNTER — Encounter: Payer: Medicare Other | Attending: Family Medicine | Admitting: Skilled Nursing Facility1

## 2017-09-06 DIAGNOSIS — E669 Obesity, unspecified: Secondary | ICD-10-CM

## 2017-09-06 DIAGNOSIS — Z713 Dietary counseling and surveillance: Secondary | ICD-10-CM | POA: Insufficient documentation

## 2017-09-06 NOTE — Progress Notes (Signed)
  Assessment:  Primary concerns today: weight management.   Pt arrives with her care giver Cheryl Dominguez. Pts care giver states the pt has gained about 10 to 12 pounds in one year.  Pts caregiver states Chriss's usual weight is about 180 pounds. Pts cartaker states they try to keep dessert to one time per day.  Lachandra lives with care giver Rhodia Albright and Meranda's daughter and she cooks for her bulk of her meals but she did nt come to the appt today. Pts caregiver states Janele gets about [phone] steps. Shantinique states she wants to increase her steps to 10000 a day. Cady and her caregiver are re[orting no issues.  Pt was given very simple and specific information for herself and her other caregiver.   MEDICATIONS: See List   DIETARY INTAKE:  Usual eating pattern includes 3 meals and 2 snacks per day.  Everyday foods include none stated.  Avoided foods include strawberries, blueberries, tomatoes.    24-hr recall: eating out 1 time a week  B ( AM): plain cheerios with 2% milk or oatmeal Snk ( AM): nuts L ( PM): chicken nuggets and belvita cookies chips raisins or taco salad  Snk ( PM): fruit D ( PM):BBQ chicken carrots and mixed vegetables  Snk ( PM):  Beverages: diet soda, water,   Usual physical activity: walking some marching videos   Estimated energy needs: 1500 calories 170 g carbohydrates 112 g protein 42 g fat  Progress Towards Goal(s):  In progress.   Intervention:  Nutrition counseling for weight management.  Goals: Eat every 3-5 hours For meals 2 carb + 3oz protein + 2cups veggies Do not cook in butter Use olive oil, canola oil, or vegetable oil Do not deep fry When eating out put half protein and carb in to go box When eating at [employer] do not get taco salad get 2 soft tacos Aim for 10000 steps  Teaching Method Utilized:  Visual Auditory Hands on  Handouts given during visit include:  Snack ideas  Meal ideas  Barriers to learning/adherence to lifestyle change:  mental deficiencies   Demonstrated degree of understanding via:  Teach Back   Monitoring/Evaluation:  Dietary intake, exercise,and body weight prn.

## 2017-09-06 NOTE — Patient Instructions (Signed)
-  When eating out box up half the meat and half the carbohydrate (rice, potatoes, etc.) but keep all of the non-starchy vegetables   -

## 2017-09-13 DIAGNOSIS — F902 Attention-deficit hyperactivity disorder, combined type: Secondary | ICD-10-CM | POA: Diagnosis not present

## 2017-09-22 ENCOUNTER — Ambulatory Visit (INDEPENDENT_AMBULATORY_CARE_PROVIDER_SITE_OTHER): Payer: Medicare Other | Admitting: Family Medicine

## 2017-09-22 VITALS — Temp 98.5°F

## 2017-09-22 DIAGNOSIS — Z23 Encounter for immunization: Secondary | ICD-10-CM

## 2017-09-27 ENCOUNTER — Encounter: Payer: Self-pay | Admitting: Family Medicine

## 2017-09-27 DIAGNOSIS — R7401 Elevation of levels of liver transaminase levels: Secondary | ICD-10-CM | POA: Insufficient documentation

## 2017-09-27 DIAGNOSIS — R740 Nonspecific elevation of levels of transaminase and lactic acid dehydrogenase [LDH]: Secondary | ICD-10-CM

## 2018-03-02 ENCOUNTER — Telehealth: Payer: Self-pay | Admitting: Family Medicine

## 2018-03-02 NOTE — Telephone Encounter (Signed)
mychart message sent to pt about their appointment with Dr Shaw °

## 2018-03-11 ENCOUNTER — Encounter: Payer: Medicare Other | Admitting: Family Medicine

## 2018-04-06 ENCOUNTER — Encounter: Payer: Self-pay | Admitting: Family Medicine

## 2018-05-10 ENCOUNTER — Other Ambulatory Visit: Payer: Self-pay

## 2018-05-10 DIAGNOSIS — Z0000 Encounter for general adult medical examination without abnormal findings: Secondary | ICD-10-CM

## 2018-05-20 ENCOUNTER — Encounter: Payer: Self-pay | Admitting: Family Medicine

## 2018-05-20 ENCOUNTER — Ambulatory Visit (INDEPENDENT_AMBULATORY_CARE_PROVIDER_SITE_OTHER): Payer: Medicare Other | Admitting: Family Medicine

## 2018-05-20 ENCOUNTER — Other Ambulatory Visit: Payer: Self-pay

## 2018-05-20 VITALS — BP 110/70 | HR 97 | Temp 98.4°F | Resp 18 | Ht 63.0 in

## 2018-05-20 DIAGNOSIS — R946 Abnormal results of thyroid function studies: Secondary | ICD-10-CM

## 2018-05-20 DIAGNOSIS — Z6834 Body mass index (BMI) 34.0-34.9, adult: Secondary | ICD-10-CM

## 2018-05-20 DIAGNOSIS — B353 Tinea pedis: Secondary | ICD-10-CM | POA: Diagnosis not present

## 2018-05-20 DIAGNOSIS — E6609 Other obesity due to excess calories: Secondary | ICD-10-CM

## 2018-05-20 DIAGNOSIS — Z5181 Encounter for therapeutic drug level monitoring: Secondary | ICD-10-CM

## 2018-05-20 DIAGNOSIS — R7301 Impaired fasting glucose: Secondary | ICD-10-CM

## 2018-05-20 DIAGNOSIS — R740 Nonspecific elevation of levels of transaminase and lactic acid dehydrogenase [LDH]: Secondary | ICD-10-CM | POA: Diagnosis not present

## 2018-05-20 DIAGNOSIS — R7401 Elevation of levels of liver transaminase levels: Secondary | ICD-10-CM

## 2018-05-20 NOTE — Patient Instructions (Addendum)
  Follow up in six months for reassessment of weight and meds as well as checking for wax buildup.   If you have lab work done today you will be contacted with your lab results within the next 2 weeks.  If you have not heard from [place] then please contact us. The fastest way to get your results is to register for My Chart.   IF you received an x-ray today, you will receive an invoice from [facility]. Please contact [facility] at [phone] with questions or concerns regarding your invoice.   IF you received labwork today, you will receive an invoice from [place]. Please contact [facility] at [phone] with questions or concerns regarding your invoice.   Our billing staff will not be able to assist you with questions regarding bills from these companies.  You will be contacted with the lab results as soon as they are available. The fastest way to get your results is to activate your My Chart account. Instructions are located on the last page of this paperwork. If you have not heard from [place] regarding the results in 2 weeks, please contact this office.

## 2018-05-20 NOTE — Progress Notes (Signed)
Established Patient Office Visit  Subjective:  Patient ID: Cheryl Dominguez, female    DOB: Oct 21, 1990  Age: 28 y.o. MRN: [id number]  CC:  Chief Complaint  Patient presents with  . Annual Exam    no pap needed pt has a gyn    HPI [address] Want presents for  Chronic athlete's foot She has chronic athletes foot for which she takes ketoconazole Her caregiver states that it helps the itching She denies any skin lesions.  Hearing loss She occasionally gets build up of wax in her ears and usually needs to get them flushed out She states that she is hearing. 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 ADD (attention deficit disorder)   . ADHD (attention deficit hyperactivity disorder)   . Allergy   . Attention deficit disorder   . Congenital abnormalities   . Constipation   . 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 Hearing loss   . Primary ovarian insufficiency   . Screening for mental disease/developmental disorder     Past Surgical History:  Procedure Laterality Date  . BACK SURGERY     scoliosis; teenage    Family History  Problem Relation Age of Onset  . Seizures Mother   . Hypothyroidism Mother   . Diabetes Father   . Heart disease Father 27       AMI as cause of death  . COPD Father   . Seizures Sister   . Mental retardation Sister     Social  History   Socioeconomic History  . Marital status: Single    Spouse name: Not on file  . Number of children: 0  . Years of education: 5  . Highest education level: High school graduate  Occupational History  . Occupation: disability    Comment: works at [employer]  . Financial resource strain: Not hard at all  . Food insecurity:    Worry: Never true    Inability: Never true  . Transportation needs:    Medical: No    Non-medical: No  Tobacco Use  . Smoking status: Never Smoker  . Smokeless tobacco: Never Used  Substance and Sexual Activity  . Alcohol use: No  . Drug use: No  . Sexual activity: Never    Birth control/protection: None  Lifestyle  . Physical activity:    Days per week: 0 days    Minutes per session: 0 min  . Stress: Not at all  Relationships  . Social connections:    Talks on phone: More than three times a week    Gets together: More than three times a week    Attends religious service: More than 4 times per year    Active member of club or organization:  No    Attends meetings of clubs or organizations: Never    Relationship status: Never married  . Intimate partner violence:    Fear of current or ex partner: No    Emotionally abused: No    Physically abused: No    Forced sexual activity: No  Other Topics Concern  . Not on file  Social History Narrative   Marital status: single; not dating in 2019.  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      Lives: Patient lives with [facility]/[name], [name] (29yo) and [name]/dog.     Education: Patient has 12th grade education. Attends [place] Monday through Friday 10:00-4:00.      Employment:  Disability.      Education: attends [employer]; M-F 10-4      Tobacco: none      Alcohol: none      Exercise: walking on weekends; some exercise at day program.  Bowls once per week.     Right handed.   Caffeine- Couple times a  week.- One    Outpatient Medications Prior to Visit  Medication Sig Dispense Refill  . calcium-vitamin D (OSCAL WITH D) 500-200 MG-UNIT tablet Take 1 tablet by mouth 2 (two) times daily. 180 tablet 3  . CRANBERRY EXTRACT PO Take by mouth 2 (two) times daily.    [name] Kitchen escitalopram (LEXAPRO) 20 MG tablet TK 1 T PO QD  5  . fluticasone (FLONASE) 50 MCG/ACT nasal spray Place 2 sprays into both nostrils daily. 48 g 3  . guanFACINE (INTUNIV) 1 MG TB24 Take by mouth daily.    [name] Kitchen ketoconazole (NIZORAL) 2 % cream Apply 1 application topically daily. X 4 weeks then as needed 60 g 2  . lisdexamfetamine (VYVANSE) 40 MG capsule Take 40 mg by mouth every morning. Reported on 01/17/2015    . loratadine (CLARITIN) 10 MG tablet Take 1 tablet (10 mg total) by mouth daily. 90 tablet 3  . LORazepam (ATIVAN) 1 MG tablet Take 1 mg by mouth daily as needed for anxiety.    . Multiple Vitamins-Minerals (MULTIVITAMIN PO) Take by mouth 2 (two) times daily.    . norethindrone-ethinyl estradiol-iron (MICROGESTIN FE,GILDESS FE,LOESTRIN FE) 1.5-30 MG-MCG tablet Take 1 tablet by mouth daily.    . polyethylene glycol powder (GLYCOLAX/MIRALAX) powder TAKE 16 GRAMS BY MOUTH DAILY 527 g 1  . Probiotic Product (PROBIOTIC DAILY PO) Take by mouth.    . risperiDONE (RISPERDAL) 1 MG tablet Take 1 mg by mouth daily.  5   No facility-administered medications prior to visit.     No Known Allergies  ROS Review of Systems Review of Systems  Constitutional: Negative for activity change, appetite change, chills and fever.  HENT: Negative for congestion, nosebleeds, trouble swallowing and voice change.  +decreased hearing  Respiratory: Negative for cough, shortness of breath and wheezing.   Gastrointestinal: Negative for diarrhea, nausea and vomiting.  Genitourinary: Negative for difficulty urinating, dysuria, flank pain and hematuria.  Musculoskeletal: Negative for back pain, joint swelling and neck pain.  Neurological: Negative for  dizziness, speech difficulty, light-headedness and numbness.  See HPI. All other review of systems negative.     Objective:    Physical Exam  Pulse 97   Temp 98.4 F (36.9 C)   Resp 18   Ht '5\' 3"'  (1.6 m)   SpO2 100%   BMI 35.61 kg/m  Wt Readings from Last 3 Encounters:  09/06/17 201 lb (91.2 kg)  08/10/17  201 lb 3.2 oz (91.3 kg)  03/09/17 195 lb (88.5 kg)   Physical Exam   Head: Normocephalic and atraumatic. Small ears without impaction, TM clear Eyes: Conjunctivae and EOM are normal.  Cardiovascular: Normal rate, regular rhythm, normal heart sounds and intact distal pulses.  No murmur heard. Pulmonary/Chest: Effort normal and breath sounds normal. No stridor. No respiratory distress. Has no wheezes.  Abdomen: nondistended, normoactive BS, soft, nontender Neurological: Is alert and oriented to person, place, and time.  Skin: Skin is warm. Capillary refill takes less than 2 seconds.  Psychiatric: Has a normal mood and affect. Behavior is normal. Judgment and thought content normal.    Health Maintenance Due  Topic Date Due  . PAP-Cervical Cytology Screening  04/14/2013    There are no preventive care reminders to display for this patient.  Lab Results  Component Value Date   TSH 3.690 08/10/2017   Lab Results  Component Value Date   WBC 6.8 08/10/2017   HGB 13.4 08/10/2017   HCT 42.9 08/10/2017   MCV 94 08/10/2017   PLT 323 08/10/2017   Lab Results  Component Value Date   NA 141 08/10/2017   K 4.3 08/10/2017   CO2 22 08/10/2017   GLUCOSE 102 (H) 08/10/2017   BUN 8 08/10/2017   CREATININE 0.70 08/10/2017   BILITOT <0.2 08/10/2017   ALKPHOS 79 08/10/2017   AST 30 08/10/2017   ALT 60 (H) 08/10/2017   PROT 6.6 08/10/2017   ALBUMIN 4.0 08/10/2017   CALCIUM 8.9 08/10/2017   Lab Results  Component Value Date   CHOL 122 12/04/2016   Lab Results  Component Value Date   HDL 42 12/04/2016   Lab Results  Component Value Date   LDLCALC 55 12/04/2016    Lab Results  Component Value Date   TRIG 125 12/04/2016   Lab Results  Component Value Date   CHOLHDL 2.9 12/04/2016   Lab Results  Component Value Date   HGBA1C 5.2 08/10/2017      Assessment & Plan:   Problem List Items Addressed This Visit    None    Visit Diagnoses    Encounter for medication monitoring    -  Primary   Relevant Orders   CMP14+EGFR   Lipid panel   Hemoglobin A1c   TSH   Elevated AST (SGOT)     Will assess trend    Relevant Orders   CMP14+EGFR   Lipid panel   TSH   Class 1 obesity due to excess calories without serious comorbidity with body mass index (BMI) of 34.0 to 34.9 in adult    - weight stable    Relevant Orders   CMP14+EGFR   Lipid panel   Hemoglobin A1c   TSH   Tinea pedis of both feet    - continue topical antifungal    Relevant Orders   Hemoglobin A1c   Impaired fasting glucose     Normal A1c Continue balanced diet   Relevant Orders   Hemoglobin A1c   Thyroid function study abnormality    -  Continue current dose TSH range is 0.5-5    Relevant Orders   TSH       No orders of the defined types were placed in this encounter.   Follow-up: No follow-ups on file.    Forrest Moron, MD

## 2018-05-21 LAB — CMP14+EGFR
ALT: 31 IU/L (ref 0–32)
AST: 19 IU/L (ref 0–40)
Albumin/Globulin Ratio: 1.3 (ref 1.2–2.2)
Albumin: 3.9 g/dL (ref 3.9–5.0)
Alkaline Phosphatase: 77 IU/L (ref 39–117)
BUN/Creatinine Ratio: 14 (ref 9–23)
BUN: 11 mg/dL (ref 6–20)
Bilirubin Total: 0.3 mg/dL (ref 0.0–1.2)
CO2: 23 mmol/L (ref 20–29)
Calcium: 9.2 mg/dL (ref 8.7–10.2)
Chloride: 102 mmol/L (ref 96–106)
Creatinine, Ser: 0.81 mg/dL (ref 0.57–1.00)
GFR calc Af Amer: 114 mL/min/{1.73_m2} (ref >59)
GFR calc non Af Amer: 98 mL/min/{1.73_m2} (ref >59)
Globulin, Total: 2.9 g/dL (ref 1.5–4.5)
Glucose: 89 mg/dL (ref 65–99)
Potassium: 4.6 mmol/L (ref 3.5–5.2)
Sodium: 140 mmol/L (ref 134–144)
Total Protein: 6.8 g/dL (ref 6.0–8.5)

## 2018-05-21 LAB — LIPID PANEL
Chol/HDL Ratio: 2.6 ratio (ref 0.0–4.4)
Cholesterol, Total: 110 mg/dL (ref 100–199)
HDL: 42 mg/dL (ref >39)
LDL Calculated: 47 mg/dL (ref 0–99)
Triglycerides: 104 mg/dL (ref 0–149)
VLDL Cholesterol Cal: 21 mg/dL (ref 5–40)

## 2018-05-21 LAB — TSH: TSH: 4.56 u[IU]/mL — ABNORMAL HIGH (ref 0.450–4.500)

## 2018-05-21 LAB — HEMOGLOBIN A1C
Est. average glucose Bld gHb Est-mCnc: 100 mg/dL
Hgb A1c MFr Bld: 5.1 % (ref 4.8–5.6)

## 2018-05-28 ENCOUNTER — Other Ambulatory Visit: Payer: Self-pay | Admitting: *Deleted

## 2018-05-28 ENCOUNTER — Telehealth: Payer: Self-pay | Admitting: Family Medicine

## 2018-05-28 MED ORDER — KETOCONAZOLE 2 % EX CREA: 1.0000 "application " | TOPICAL_CREAM | Freq: Every day | CUTANEOUS | 0 refills | Status: DC

## 2018-05-28 MED ORDER — FLUTICASONE PROPIONATE 50 MCG/ACT NA SUSP: 2.0000 | Freq: Every day | NASAL | 1 refills | Status: DC

## 2018-05-28 NOTE — Telephone Encounter (Signed)
Copied from [place] [id number]. Topic: Quick Communication - Rx Refill/Question >> May 28, 2018 10:54 AM Richardo Priest, NT wrote: Medication:  ketoconazole (NIZORAL) 2 % cream fluticasone (FLONASE) 50 MCG/ACT nasal spray  Has the patient contacted their pharmacy? Yes. Pharmacy advised them to contact office.  Preferred Pharmacy (with phone number or street name):  [facility] [place], [address] AT [address] [phone] (Phone) [phone] (Fax)  Agent: Please be advised that RX refills may take up to 3 business days. We ask that you follow-up with your pharmacy.

## 2018-06-14 ENCOUNTER — Encounter: Payer: Self-pay | Admitting: Family Medicine

## 2018-06-14 NOTE — Progress Notes (Unsigned)
HPV DNA HI RISK   -NEG  Thinprep

## 2018-09-11 ENCOUNTER — Telehealth: Payer: Self-pay | Admitting: Family Medicine

## 2018-09-11 NOTE — Telephone Encounter (Signed)
Returned Gaurdians call to schedule nurses visit for Cheryl Dominguez LVM for her to call back to schedule FR

## 2018-09-19 ENCOUNTER — Ambulatory Visit (INDEPENDENT_AMBULATORY_CARE_PROVIDER_SITE_OTHER): Payer: Medicare Other | Admitting: Family Medicine

## 2018-09-19 ENCOUNTER — Other Ambulatory Visit: Payer: Self-pay

## 2018-09-19 DIAGNOSIS — Z0000 Encounter for general adult medical examination without abnormal findings: Secondary | ICD-10-CM

## 2018-09-19 DIAGNOSIS — Z23 Encounter for immunization: Secondary | ICD-10-CM | POA: Diagnosis not present

## 2018-09-19 NOTE — Progress Notes (Signed)
Pt on the schedule for fast track labs, but pt guardian stated that the pt was only here for a flu shot. The flu shot was administered and no labs were drawn.

## 2018-10-19 ENCOUNTER — Encounter: Payer: Self-pay | Admitting: Family Medicine

## 2018-11-07 ENCOUNTER — Other Ambulatory Visit: Payer: Self-pay | Admitting: Family Medicine

## 2018-11-07 MED ORDER — KETOCONAZOLE 2 % EX CREA: 1.0000 "application " | TOPICAL_CREAM | Freq: Every day | CUTANEOUS | 0 refills | Status: DC

## 2018-11-07 NOTE — Telephone Encounter (Signed)
Copied from [place] [phone] [phone]. Topic: Quick Communication - Rx Refill/Question >> Nov 07, 2018  1:01 PM Rainey Pines A wrote: Medication: ketoconazole (NIZORAL) 2 % cream  Has the patient contacted their pharmacy? Yes (Agent: If no, request that the patient contact the pharmacy for the refill.) (Agent: If yes, when and what did the pharmacy advise?)Contact PCP  Preferred Pharmacy (with phone number or street name):[facility] [place], [address] AT [phone] (Phone) [phone] (Fax)    Agent: Please be advised that RX refills may take up to 3 business days. We ask that you follow-up with your pharmacy.

## 2018-12-03 ENCOUNTER — Other Ambulatory Visit: Payer: Self-pay

## 2018-12-03 ENCOUNTER — Encounter: Payer: Self-pay | Admitting: Family Medicine

## 2018-12-03 ENCOUNTER — Ambulatory Visit (INDEPENDENT_AMBULATORY_CARE_PROVIDER_SITE_OTHER): Payer: Medicare Other | Admitting: Family Medicine

## 2018-12-03 VITALS — BP 103/72 | HR 83 | Temp 98.3°F | Resp 17 | Ht 63.0 in | Wt 187.6 lb

## 2018-12-03 DIAGNOSIS — R946 Abnormal results of thyroid function studies: Secondary | ICD-10-CM | POA: Diagnosis not present

## 2018-12-03 DIAGNOSIS — E6609 Other obesity due to excess calories: Secondary | ICD-10-CM | POA: Diagnosis not present

## 2018-12-03 DIAGNOSIS — B353 Tinea pedis: Secondary | ICD-10-CM | POA: Diagnosis not present

## 2018-12-03 DIAGNOSIS — Z6832 Body mass index (BMI) 32.0-32.9, adult: Secondary | ICD-10-CM

## 2018-12-03 DIAGNOSIS — E221 Hyperprolactinemia: Secondary | ICD-10-CM

## 2018-12-03 MED ORDER — NYSTATIN 100000 UNIT/GM EX POWD: Freq: Two times a day (BID) | CUTANEOUS | 0 refills | Status: DC

## 2018-12-03 NOTE — Progress Notes (Signed)
Established Patient Office Visit  Subjective:  Patient ID: Cheryl Dominguez, female    DOB: [birth year]  Age: 28 y.o. MRN: [id number]  CC:  Chief Complaint  Patient presents with  . 6 month f/u    HPI Cheryl Dominguez presents for    Thyroid  Patient reports that she is feeling well  Lab Results  Component Value Date   TSH 4.560 (H) 05/20/2018   She is snacking less and consuming more protein She is taking her thyroid   Wt Readings from Last 3 Encounters:  12/03/18 187 lb 9.6 oz (85.1 kg)  09/06/17 201 lb (91.2 kg)  08/10/17 201 lb 3.2 oz (91.3 kg)     Past Medical History:  Diagnosis Date  . ADD (attention deficit disorder)   . ADHD (attention deficit hyperactivity disorder)   . Allergy   . Attention deficit disorder   . Congenital abnormalities   . Constipation   . 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 Hearing loss   . Primary ovarian insufficiency   . Screening for mental disease/developmental disorder     Past Surgical History:  Procedure Laterality Date  . BACK SURGERY     scoliosis; teenage    Family History  Problem Relation Age of Onset  . Seizures Mother   . Hypothyroidism Mother   . Diabetes Father   . Heart disease Father 76       AMI as cause of death  . COPD Father   . Seizures Sister   . Mental retardation Sister     Social History   Socioeconomic History  . Marital status: Single    Spouse name: Not on file  . Number of children: 0  . Years of education: 47  . Highest education level: High school graduate  Occupational History  . Occupation: disability    Comment: works at [employer]  . Financial resource strain: Not hard at all  . Food insecurity    Worry: Never true    Inability: Never true  . Transportation needs    Medical: No    Non-medical: No  Tobacco Use  . Smoking status: Never Smoker  . Smokeless tobacco:  Never Used  Substance and Sexual Activity  . Alcohol use: No  . Drug use: No  . Sexual activity: Never    Birth control/protection: None  Lifestyle  . Physical activity    Days per week: 0 days    Minutes per session: 0 min  . Stress: Not at all  Relationships  . Social connections    Talks on phone: More than three times a week    Gets together: More than three times a week    Attends religious service: More than 4 times per year    Active member of club or organization: No    Attends meetings of clubs or organizations: Never    Relationship status: Never married  . Intimate partner violence    Fear of current or ex partner: No    Emotionally abused: No    Physically abused: No    Forced sexual activity: No  Other Topics Concern  . Not on file  Social History Narrative   Marital status: single; not dating in 2019.  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      Lives: Patient lives with [facility]/[name], [name] (29yo) and [name]/dog.  Education: Patient has 12th grade education. Attends [employer] Monday through Friday 10:00-4:00.      Employment:  Disability.      Education: attends [employer]; M-F 10-4      Tobacco: none      Alcohol: none      Exercise: walking on weekends; some exercise at day program.  Bowls once per week.     Right handed.   Caffeine- Couple times a week.- One    Outpatient Medications Prior to Visit  Medication Sig Dispense Refill  . Ascorbic Acid (VITAMIN C) 100 MG tablet Take 100 mg by mouth daily.    . calcium-vitamin D (OSCAL WITH D) 500-200 MG-UNIT tablet Take 1 tablet by mouth 2 (two) times daily. 180 tablet 3  . CRANBERRY EXTRACT PO Take by mouth 2 (two) times daily.    [name] Kitchen escitalopram (LEXAPRO) 20 MG tablet TK 1 T PO QD  5  . fluticasone (FLONASE) 50 MCG/ACT nasal spray Place 2 sprays into both nostrils daily. 48 g 1  . guanFACINE (INTUNIV) 1 MG TB24 Take by mouth  daily.    [name] Kitchen ketoconazole (NIZORAL) 2 % cream Apply 1 application topically daily. X 4 weeks then as needed 60 g 0  . lisdexamfetamine (VYVANSE) 40 MG capsule Take 40 mg by mouth every morning. Reported on 01/17/2015    . loratadine (CLARITIN) 10 MG tablet Take 1 tablet (10 mg total) by mouth daily. 90 tablet 3  . LORazepam (ATIVAN) 1 MG tablet Take 1 mg by mouth daily as needed for anxiety.    . Multiple Vitamins-Minerals (MULTIVITAMIN PO) Take by mouth 2 (two) times daily.    . norethindrone-ethinyl estradiol-iron (MICROGESTIN FE,GILDESS FE,LOESTRIN FE) 1.5-30 MG-MCG tablet Take 1 tablet by mouth daily.    . polyethylene glycol powder (GLYCOLAX/MIRALAX) powder TAKE 16 GRAMS BY MOUTH DAILY 527 g 1  . Probiotic Product (PROBIOTIC DAILY PO) Take by mouth.    . risperiDONE (RISPERDAL) 1 MG tablet Take 1 mg by mouth daily.  5   No facility-administered medications prior to visit.     No Known Allergies  ROS Review of Systems    Objective:    Physical Exam  BP 103/72 (BP Location: Right Arm, Patient Position: Sitting, Cuff Size: Large)   Pulse 83   Temp 98.3 F (36.8 C) (Temporal)   Resp 17   Ht 5\' 3"  (1.6 m)   Wt 187 lb 9.6 oz (85.1 kg)   LMP 11/27/2018   SpO2 98%   BMI 33.23 kg/m  Wt Readings from Last 3 Encounters:  12/03/18 187 lb 9.6 oz (85.1 kg)  09/06/17 201 lb (91.2 kg)  08/10/17 201 lb 3.2 oz (91.3 kg)     Health Maintenance Due  Topic Date Due  . PAP-Cervical Cytology Screening  04/14/2013    There are no preventive care reminders to display for this patient.  Lab Results  Component Value Date   TSH 4.560 (H) 05/20/2018   Lab Results  Component Value Date   WBC 6.8 08/10/2017   HGB 13.4 08/10/2017   HCT 42.9 08/10/2017   MCV 94 08/10/2017   PLT 323 08/10/2017   Lab Results  Component Value Date   NA 140 05/20/2018   K 4.6 05/20/2018   CO2 23 05/20/2018   GLUCOSE 89 05/20/2018   BUN 11 05/20/2018   CREATININE 0.81 05/20/2018   BILITOT 0.3  05/20/2018   ALKPHOS 77 05/20/2018   AST  19 05/20/2018   ALT 31 05/20/2018   PROT 6.8 05/20/2018   ALBUMIN 3.9 05/20/2018   CALCIUM 9.2 05/20/2018   Lab Results  Component Value Date   CHOL 110 05/20/2018   Lab Results  Component Value Date   HDL 42 05/20/2018   Lab Results  Component Value Date   LDLCALC 47 05/20/2018   Lab Results  Component Value Date   TRIG 104 05/20/2018   Lab Results  Component Value Date   CHOLHDL 2.6 05/20/2018   Lab Results  Component Value Date   HGBA1C 5.1 05/20/2018      Assessment & Plan:   Problem List Items Addressed This Visit      Endocrine   Hyperprolactinemia ([place])   Relevant Orders   TSH     Other   Class 1 obesity due to excess calories without serious comorbidity with body mass index (BMI) of 32.0 to 32.9 in adult - Primary    Other Visit Diagnoses    Tinea pedis of both feet       Relevant Medications   nystatin (MYCOSTATIN/NYSTOP) powder   Thyroid function study abnormality          Meds ordered this encounter  Medications  . nystatin (MYCOSTATIN/NYSTOP) powder    Sig: Apply topically 2 (two) times daily. To dry feet    Dispense:  15 g    Refill:  0    Follow-up: No follow-ups on file.    Forrest Moron, MD

## 2018-12-03 NOTE — Patient Instructions (Addendum)
If you have lab work done today you will be contacted with your lab results within the next 2 weeks.  If you have not heard from [place] then please contact us. The fastest way to get your results is to register for My Chart.   IF you received an x-ray today, you will receive an invoice from [facility] Radiology. Please contact [facility] Radiology at [phone] with questions or concerns regarding your invoice.   IF you received labwork today, you will receive an invoice from [facility]. Please contact [facility] at [phone] with questions or concerns regarding your invoice.   Our billing staff will not be able to assist you with questions regarding bills from these companies.  You will be contacted with the lab results as soon as they are available. The fastest way to get your results is to activate your My Chart account. Instructions are located on the last page of this paperwork. If you have not heard from [place] regarding the results in 2 weeks, please contact this office.     Athlete's Foot  Athlete's foot (tinea pedis) is a fungal infection of the skin on your feet. It often occurs on the skin that is between or underneath the toes. It can also occur on the soles of your feet. The infection can spread from person to person (is contagious). It can also spread when a person's bare feet come in contact with the fungus on shower floors or on items such as shoes. What are the causes? This condition is caused by a fungus that grows in warm, moist places. You can get athlete's foot by sharing shoes, shower stalls, towels, and wet floors with someone who is infected. Not washing your feet or changing your socks often enough can also lead to athlete's foot. What increases the risk? This condition is more likely to develop in:  Men.  People who have a weak body defense system (immune system).  People who have diabetes.  People who use public showers, such as at a gym.  People who wear  heavy-duty shoes, such as Environmental manager.  Seasons with warm, humid weather. What are the signs or symptoms? Symptoms of this condition include:  Itchy areas between your toes or on the soles of your feet.  White, flaky, or scaly areas between your toes or on the soles of your feet.  Very itchy small blisters between your toes or on the soles of your feet.  Small cuts in your skin. These cuts can become infected.  Thick or discolored toenails. How is this diagnosed? This condition may be diagnosed with a physical exam and a review of your medical history. Your health care provider may also take a skin or toenail sample to examine under a microscope. How is this treated? This condition is treated with antifungal medicines. These may be applied as powders, ointments, or creams. In severe cases, an oral antifungal medicine may be given. Follow these instructions at home: Medicines  Apply or take over-the-counter and prescription medicines only as told by your health care provider.  Apply your antifungal medicine as told by your health care provider. Do not stop using the antifungal even if your condition improves. Foot care  Do not scratch your feet.  Keep your feet dry: ? Wear cotton or wool socks. Change your socks every day or if they become wet. ? Wear shoes that allow air to flow, such as sandals or canvas tennis shoes.  Wash and dry your  feet, including the area between your toes. Also, wash and dry your feet: ? Every day or as told by your health care provider. ? After exercising. General instructions  Do not let others use towels, shoes, nail clippers, or other personal items that touch your feet.  Protect your feet by wearing sandals in wet areas, such as locker rooms and shared showers.  Keep all follow-up visits as told by your health care provider. This is important.  If you have diabetes, keep your blood sugar under control. Contact a health care  provider if:  You have a fever.  You have swelling, soreness, warmth, or redness in your foot.  Your feet are not getting better with treatment.  Your symptoms get worse.  You have new symptoms. Summary  Athlete's foot (tinea pedis) is a fungal infection of the skin on your feet. It often occurs on skin that is between or underneath the toes.  This condition is caused by a fungus that grows in warm, moist places.  Symptoms include white, flaky, or scaly areas between your toes or on the soles of your feet.  This condition is treated with antifungal medicines.  Keep your feet clean. Always dry them thoroughly. This information is not intended to replace advice given to you by your health care provider. Make sure you discuss any questions you have with your health care provider. Document Released: 12/17/1999 Document Revised: 12/14/2016 Document Reviewed: 10/09/2016 Elsevier Patient Education  2020 [employer].

## 2018-12-04 LAB — TSH: TSH: 2.54 u[IU]/mL (ref 0.450–4.500)

## 2018-12-09 ENCOUNTER — Ambulatory Visit: Payer: Medicare Other | Admitting: Family Medicine

## 2019-01-10 ENCOUNTER — Other Ambulatory Visit: Payer: Self-pay | Admitting: Family Medicine

## 2019-02-07 ENCOUNTER — Encounter: Payer: Self-pay | Admitting: Family Medicine

## 2019-02-28 ENCOUNTER — Encounter: Payer: Self-pay | Admitting: Family Medicine

## 2019-03-12 ENCOUNTER — Other Ambulatory Visit: Payer: Self-pay | Admitting: Obstetrics & Gynecology

## 2019-03-12 DIAGNOSIS — E2839 Other primary ovarian failure: Secondary | ICD-10-CM

## 2019-04-21 ENCOUNTER — Other Ambulatory Visit: Payer: Medicare Other

## 2019-06-26 ENCOUNTER — Other Ambulatory Visit: Payer: Self-pay

## 2019-06-26 ENCOUNTER — Ambulatory Visit
Admission: RE | Admit: 2019-06-26 | Discharge: 2019-06-26 | Disposition: A | Discharge status: Home / Self Care | Payer: Medicare Other | Source: Ambulatory Visit | Attending: Obstetrics & Gynecology | Admitting: Obstetrics & Gynecology

## 2019-06-26 DIAGNOSIS — E2839 Other primary ovarian failure: Secondary | ICD-10-CM

## 2019-06-27 ENCOUNTER — Ambulatory Visit (INDEPENDENT_AMBULATORY_CARE_PROVIDER_SITE_OTHER): Payer: Medicare Other | Admitting: Family Medicine

## 2019-06-27 ENCOUNTER — Encounter: Payer: Self-pay | Admitting: Family Medicine

## 2019-06-27 VITALS — BP 100/64 | HR 81 | Temp 98.6°F | Resp 15 | Ht 63.0 in | Wt 191.0 lb

## 2019-06-27 DIAGNOSIS — L309 Dermatitis, unspecified: Secondary | ICD-10-CM | POA: Diagnosis not present

## 2019-06-27 LAB — POCT SKIN KOH: Skin KOH, POC: NEGATIVE

## 2019-06-27 MED ORDER — BETAMETHASONE DIPROPIONATE 0.05 % EX CREA: TOPICAL_CREAM | Freq: Two times a day (BID) | CUTANEOUS | 1 refills | Status: DC

## 2019-06-27 NOTE — Patient Instructions (Addendum)
   The flaking skin of the feet looked suspicious for fungus, but on microscopic examination no fungus is noted.  It is therefore most probably a allergic eczema. Wear cotton socks and keep feet dry.  Use betamethasone cream twice daily on soles of feet.  You can also continue using a moistening cream.  If not improving over the next week or 2 contact us and we will make a referral to a dermatologist.  Return as needed   If you have lab work done today you will be contacted with your lab results within the next 2 weeks.  If you have not heard from [place] then please contact us. The fastest way to get your results is to register for My Chart.   IF you received an x-ray today, you will receive an invoice from [facility] Radiology. Please contact [facility] Radiology at [phone] with questions or concerns regarding your invoice.   IF you received labwork today, you will receive an invoice from [facility]. Please contact [facility] at [phone] with questions or concerns regarding your invoice.   Our billing staff will not be able to assist you with questions regarding bills from these companies.  You will be contacted with the lab results as soon as they are available. The fastest way to get your results is to activate your My Chart account. Instructions are located on the last page of this paperwork. If you have not heard from [place] regarding the results in 2 weeks, please contact this office.

## 2019-06-27 NOTE — Progress Notes (Signed)
Patient ID: Cheryl Dominguez, female    DOB: 1990-03-07  Age: 29 y.o. MRN: [id number]  Chief Complaint  Patient presents with  . Recurrent Skin Infections    Pt complains of possible athletes foot in both feet,  notes it is itchy, red, dry, and irritated. pt is unsure how long this has been going on but it has been several days     Subjective:  Patient is here with a rash on her feet that itches.  She says is only been going on for few days.  Current allergies, medications, problem list, past/family and social histories reviewed.  Objective:  BP 100/64   Pulse 81   Temp 98.6 F (37 C) (Temporal)   Resp 15   Ht 5\' 3"  (1.6 m)   Wt 191 lb (86.6 kg)   SpO2 96%   BMI 33.83 kg/m   Appears to be a chronic appearing rash with peeling on the soles of her feet and flaking of the skin.  A little bit goes into the area between the toes but that does not seem primary.  It is on the flat sole of the feet.  None on her hands.  Transverse palmar crease on hands.  I did a KOH scraping with lots of skin particles but no fungal particles noted.  Assessment & Plan:   Assessment: 1. Chronic eczema of foot       Plan: I expected to find fungal elements, but since there are none I assume this is an allergic eczematoid type problem.  Orders Placed This Encounter  Procedures  . POCT Skin KOH    Meds ordered this encounter  Medications  . betamethasone dipropionate 0.05 % cream    Sig: Apply topically 2 (two) times daily.    Dispense:  45 g    Refill:  1         Patient Instructions     The flaking skin of the feet looked suspicious for fungus, but on microscopic examination no fungus is noted.  It is therefore most probably a allergic eczema. Wear cotton socks and keep feet dry.  Use betamethasone cream twice daily on soles of feet.  You can also continue using a moistening cream.  If not improving over the next week or 2 contact us and we will make a referral to a  dermatologist.  Return as needed   If you have lab work done today you will be contacted with your lab results within the next 2 weeks.  If you have not heard from [place] then please contact us. The fastest way to get your results is to register for My Chart.   IF you received an x-ray today, you will receive an invoice from [facility]. Please contact [facility] at [phone] with questions or concerns regarding your invoice.   IF you received labwork today, you will receive an invoice from [facility]. Please contact [facility] at [phone] with questions or concerns regarding your invoice.   Our billing staff will not be able to assist you with questions regarding bills from these companies.  You will be contacted with the lab results as soon as they are available. The fastest way to get your results is to activate your My Chart account. Instructions are located on the last page of this paperwork. If you have not heard from [place] regarding the results in 2 weeks, please contact this office.        Return if symptoms worsen or fail to improve.  Ruben Reason, MD 06/27/2019

## 2019-08-04 ENCOUNTER — Ambulatory Visit: Payer: Medicare Other | Admitting: Family Medicine

## 2019-08-18 ENCOUNTER — Encounter: Payer: Self-pay | Admitting: Family Medicine

## 2019-08-18 ENCOUNTER — Ambulatory Visit (INDEPENDENT_AMBULATORY_CARE_PROVIDER_SITE_OTHER): Payer: Medicare Other | Admitting: Family Medicine

## 2019-08-18 ENCOUNTER — Other Ambulatory Visit: Payer: Self-pay

## 2019-08-18 ENCOUNTER — Ambulatory Visit: Payer: Medicare Other

## 2019-08-18 VITALS — BP 106/73 | HR 95 | Temp 98.7°F | Ht 63.0 in | Wt 188.0 lb

## 2019-08-18 DIAGNOSIS — B353 Tinea pedis: Secondary | ICD-10-CM | POA: Diagnosis not present

## 2019-08-18 DIAGNOSIS — L309 Dermatitis, unspecified: Secondary | ICD-10-CM | POA: Diagnosis not present

## 2019-08-18 DIAGNOSIS — J309 Allergic rhinitis, unspecified: Secondary | ICD-10-CM | POA: Diagnosis not present

## 2019-08-18 MED ORDER — CLOTRIMAZOLE-BETAMETHASONE 1-0.05 % EX CREA: 1.0000 "application " | TOPICAL_CREAM | Freq: Two times a day (BID) | CUTANEOUS | 1 refills | Status: DC

## 2019-08-18 NOTE — Patient Instructions (Addendum)
Foot rash could be due to combination of eczema and fungus/athlete's foot.  See information below.  Try changing to Lotrisone cream twice per day for the next 2 weeks, then if symptoms are improved can use just as needed.  Follow-up if symptoms are worsening, otherwise recheck in 3 months requesting some blood work at that time.   Athlete's Foot  Athlete's foot (tinea pedis) is a fungal infection of the skin on your feet. It often occurs on the skin that is between or underneath the toes. It can also occur on the soles of your feet. The infection can spread from person to person (is contagious). It can also spread when a person's bare feet come in contact with the fungus on shower floors or on items such as shoes. What are the causes? This condition is caused by a fungus that grows in warm, moist places. You can get athlete's foot by sharing shoes, shower stalls, towels, and wet floors with someone who is infected. Not washing your feet or changing your socks often enough can also lead to athlete's foot. What increases the risk? This condition is more likely to develop in:  Men.  People who have a weak body defense system (immune system).  People who have diabetes.  People who use public showers, such as at a gym.  People who wear heavy-duty shoes, such as Environmental manager.  Seasons with warm, humid weather. What are the signs or symptoms? Symptoms of this condition include:  Itchy areas between your toes or on the soles of your feet.  White, flaky, or scaly areas between your toes or on the soles of your feet.  Very itchy small blisters between your toes or on the soles of your feet.  Small cuts in your skin. These cuts can become infected.  Thick or discolored toenails. How is this diagnosed? This condition may be diagnosed with a physical exam and a review of your medical history. Your health care provider may also take a skin or toenail sample to examine under a  microscope. How is this treated? This condition is treated with antifungal medicines. These may be applied as powders, ointments, or creams. In severe cases, an oral antifungal medicine may be given. Follow these instructions at home: Medicines  Apply or take over-the-counter and prescription medicines only as told by your health care provider.  Apply your antifungal medicine as told by your health care provider. Do not stop using the antifungal even if your condition improves. Foot care  Do not scratch your feet.  Keep your feet dry: ? Wear cotton or wool socks. Change your socks every day or if they become wet. ? Wear shoes that allow air to flow, such as sandals or canvas tennis shoes.  Wash and dry your feet, including the area between your toes. Also, wash and dry your feet: ? Every day or as told by your health care provider. ? After exercising. General instructions  Do not let others use towels, shoes, nail clippers, or other personal items that touch your feet.  Protect your feet by wearing sandals in wet areas, such as locker rooms and shared showers.  Keep all follow-up visits as told by your health care provider. This is important.  If you have diabetes, keep your blood sugar under control. Contact a health care provider if:  You have a fever.  You have swelling, soreness, warmth, or redness in your foot.  Your feet are not getting better with treatment.  Your symptoms get worse.  You have new symptoms. Summary  Athlete's foot (tinea pedis) is a fungal infection of the skin on your feet. It often occurs on skin that is between or underneath the toes.  This condition is caused by a fungus that grows in warm, moist places.  Symptoms include white, flaky, or scaly areas between your toes or on the soles of your feet.  This condition is treated with antifungal medicines.  Keep your feet clean. Always dry them thoroughly. This information is not intended to  replace advice given to you by your health care provider. Make sure you discuss any questions you have with your health care provider. Document Revised: 12/14/2016 Document Reviewed: 10/09/2016 Elsevier Patient Education  [employer].    If you have lab work done today you will be contacted with your lab results within the next 2 weeks.  If you have not heard from [place] then please contact us. The fastest way to get your results is to register for My Chart.   IF you received an x-ray today, you will receive an invoice from [facility]. Please contact [facility] Radiology at [phone] with questions or concerns regarding your invoice.   IF you received labwork today, you will receive an invoice from [place]. Please contact [facility] at [phone] with questions or concerns regarding your invoice.   Our billing staff will not be able to assist you with questions regarding bills from these companies.  You will be contacted with the lab results as soon as they are available. The fastest way to get your results is to activate your My Chart account. Instructions are located on the last page of this paperwork. If you have not heard from [place] regarding the results in 2 weeks, please contact this office.

## 2019-08-18 NOTE — Progress Notes (Signed)
Subjective:  Patient ID: Cheryl Dominguez, female    DOB: 1990-04-30  Age: 29 y.o. MRN: [id number]  CC:  Chief Complaint  Patient presents with  . Establish Care    Pt reports she is feeling good. Pt would like to follow-up on her feet from her v isit with Dr.Hopper on 06/27/2019. Pt reports they are doing better and are less itchy, but would like Providers thoughts. feet look a little dry, but no signs of fungis or irritation.Pt isn't fasting today.    HPI Cheryl Dominguez presents for  Establish care.  Previous patient of Dr. Nolon Rod. Here with guardian Magda Paganini. History of developmental delay, ADD, ovarian insufficiency, hyperprolactinemia.   Treated by Dr. Delsa Sale with OB/GYN at [facility], [place].  Psychiatric NP - Teryl Lucy.  Optho: Everitt Amber  [facility] for hearing aids.  [facility] - [facility] - provide rehabilitative services, day programs - needs form signed. (other Rx from psych - separate form for psychiatrist).  Allergic rhinitis: Treated with otc loratadine,  flonase Rx - 1 spray per side.   Chronic constipation: Doing ok with miralax qd.  Treated in December for tinea pedis with nystatin powder.  Seen again June 25 by Dr. Linna Darner, found to have component of chronic eczema.  No fungal elements noted on KOH.  Treated with betamethasone 0.05% twice daily.  Using betamethasone daily. Not sure if daily.  Still itching. Less peeling, but still itchy.    Lab Results  Component Value Date   TSH 2.540 12/03/2018     History Patient Active Problem List   Diagnosis Date Noted  . Elevated alanine aminotransferase (ALT) level 09/27/2017  . Class 1 obesity due to excess calories without serious comorbidity with body mass index (BMI) of 32.0 to 32.9 in adult 12/01/2015  . Pure hypercholesterolemia 12/01/2015  . Seasonal allergic rhinitis due to pollen 12/01/2014  . Slow transit constipation 12/01/2014  . Primary ovarian insufficiency 12/01/2014   . Hearing loss 12/01/2014  . 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 Hyperprolactinemia ([place]) 08/24/2014  . Seizures ([place]) 06/26/2012  . ADD (attention deficit disorder)   . Kyphosis 01/25/2011  . Mental developmental delay 01/25/2011   Past Medical History:  Diagnosis Date  . ADD (attention deficit disorder)   . ADHD (attention deficit hyperactivity disorder)   . Allergy   . Attention deficit disorder   . Congenital abnormalities   . Constipation   . 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 Hearing loss   . Primary ovarian insufficiency   . Screening for mental disease/developmental disorder    Past Surgical History:  Procedure Laterality Date  . BACK SURGERY     scoliosis; teenage  . SPINE SURGERY N/A    Phreesia 08/16/2019   No Known Allergies Prior to Admission medications   Medication Sig Start Date End Date Taking? Authorizing Provider  Ascorbic Acid (VITAMIN C) 100 MG tablet Take 100 mg by mouth daily.   Yes [provider]  betamethasone dipropionate 0.05 % cream Apply topically 2 (two) times daily. 06/27/19  Yes Posey Boyer, MD  calcium-vitamin D (OSCAL WITH D) 500-200 MG-UNIT tablet Take 1 tablet by mouth 2 (two) times daily. 06/28/16  Yes Wardell Honour, MD  CRANBERRY EXTRACT PO Take by mouth 2 (two) times daily.   Yes [provider]  escitalopram (LEXAPRO) 20 MG tablet TK 1 T PO  QD 02/10/17  Yes [provider]  fluticasone (FLONASE) 50 MCG/ACT nasal spray Place 2 sprays into both nostrils daily. 05/28/18  Yes Delia Chimes A, MD  guanFACINE (INTUNIV) 1 MG TB24 Take by mouth daily.   Yes [provider]  lisdexamfetamine (VYVANSE) 40 MG capsule Take 40 mg by mouth every morning. Reported on 01/17/2015   Yes [provider]  loratadine (CLARITIN) 10 MG tablet Take 1 tablet (10 mg total) by mouth daily. 04/01/17  Yes Wardell Honour, MD    LORazepam (ATIVAN) 1 MG tablet Take 1 mg by mouth daily as needed for anxiety.   Yes [provider]  Multiple Vitamins-Minerals (MULTIVITAMIN PO) Take by mouth 2 (two) times daily.   Yes [provider]  norethindrone-ethinyl estradiol-iron (MICROGESTIN FE,GILDESS FE,LOESTRIN FE) 1.5-30 MG-MCG tablet Take 1 tablet by mouth daily.   Yes [provider]  polyethylene glycol powder (GLYCOLAX/MIRALAX) powder TAKE 16 GRAMS BY MOUTH DAILY 11/26/16  Yes Wardell Honour, MD  Probiotic Product (PROBIOTIC DAILY PO) Take by mouth.   Yes [provider]  risperiDONE (RISPERDAL) 1 MG tablet Take 1 mg by mouth daily. 11/18/16  Yes [provider]  ketoconazole (NIZORAL) 2 % cream APPLY EXTERNALLY TO THE AFFECTED AREA DAILY FOR 4 WEEKS THEN AS NEEDED Patient not taking: Reported on 08/18/2019 01/10/19   Forrest Moron, MD  nystatin (MYCOSTATIN/NYSTOP) powder Apply topically 2 (two) times daily. To dry feet Patient not taking: Reported on 08/18/2019 12/03/18   Forrest Moron, MD   Social History   Socioeconomic History  . Marital status: Single    Spouse name: Not on file  . Number of children: 0  . Years of education: 18  . Highest education level: High school graduate  Occupational History  . Occupation: disability    Comment: works at [employer]  . Smoking status: Never Smoker  . Smokeless tobacco: Never Used  Vaping Use  . Vaping Use: Never used  Substance and Sexual Activity  . Alcohol use: No  . Drug use: No  . Sexual activity: Never    Birth control/protection: None  Other Topics Concern  . Not on file  Social History Narrative   Marital status: single; not dating in 2019.  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      Lives: Patient lives with [facility]/[name], [name] (30yo) and [name]/dog.     Education: Patient has 12th grade education. Attends [employer]  Monday through Friday 10:00-4:00.      Employment:  Disability.      Education: attends [employer]; M-F 10-4      Tobacco: none      Alcohol: none      Exercise: walking on weekends; some exercise at day program.  Bowls once per week.     Right handed.   Caffeine- Couple times a week.- One   Social Determinants of Health   Financial Resource Strain:   . Difficulty of Paying Living Expenses:   Food Insecurity:   . Worried About Charity fundraiser in the Last Year:   . Arboriculturist in the Last Year:   Transportation Needs:   . Film/video editor (Medical):   [name] Kitchen Lack of Transportation (Non-Medical):   Physical Activity:   . Days of Exercise per Week:   . Minutes of Exercise per Session:   Stress:   . Feeling of Stress :  Social Connections:   . Frequency of Communication with Friends and Family:   . Frequency of Social Gatherings with Friends and Family:   . Attends Religious Services:   . Active Member of Clubs or Organizations:   . Attends Archivist Meetings:   [name] Kitchen Marital Status:   Intimate Partner Violence:   . Fear of Current or Ex-Partner:   . Emotionally Abused:   [name] Kitchen Physically Abused:   . Sexually Abused:     Review of Systems Per HPI.   Objective:   Vitals:   08/18/19 0927  BP: 106/73  Pulse: 95  Temp: 98.7 F (37.1 C)  TempSrc: Temporal  SpO2: 97%  Weight: 188 lb (85.3 kg)  Height: '5\' 3"'  (1.6 m)     Physical Exam Vitals reviewed.  Constitutional:      Appearance: She is well-developed.  HENT:     Head: Normocephalic and atraumatic.  Eyes:     Conjunctiva/sclera: Conjunctivae normal.     Pupils: Pupils are equal, round, and reactive to light.  Neck:     Vascular: No carotid bruit.  Cardiovascular:     Rate and Rhythm: Normal rate and regular rhythm.     Heart sounds: Normal heart sounds.  Pulmonary:     Effort: Pulmonary effort is normal.     Breath sounds: Normal breath sounds.  Abdominal:     Palpations: Abdomen is  soft. There is no pulsatile mass.  Skin:    General: Skin is warm and dry.     Comments: Multiple areas of faint peeling on plantar feet bilaterally, interdigital areas appear clear.  Few possible areas of small crusted vesicles on plantar heel of left.  No active vesicles.  No erythema or soft tissue swelling  Neurological:     Mental Status: She is alert and oriented to person, place, and time.  Psychiatric:        Mood and Affect: Mood normal.        Behavior: Behavior normal.          Assessment & Plan:  Cheryl Dominguez is a 29 y.o. female . Tinea pedis of both feet Chronic eczema of foot  -Possible component of tinea pedis with eczema.  Minimal change with betamethasone.  We will try Lotrisone twice daily for the next 2 weeks, then as needed if symptoms improved.  RTC precautions, handout given.  Allergic rhinitis, unspecified seasonality, unspecified trigger  -Stable, continue Flonase, loratadine.  Correct dosing/technique of Flonase discussed.  Understanding expressed.  Recheck in 3 months, consider labs at that time.  Meds ordered this encounter  Medications  . clotrimazole-betamethasone (LOTRISONE) cream    Sig: Apply 1 application topically 2 (two) times daily.    Dispense:  45 g    Refill:  1   Patient Instructions    Foot rash could be due to combination of eczema and fungus/athlete's foot.  See information below.  Try changing to Lotrisone cream twice per day for the next 2 weeks, then if symptoms are improved can use just as needed.  Follow-up if symptoms are worsening, otherwise recheck in 3 months requesting some blood work at that time.   Athlete's Foot  Athlete's foot (tinea pedis) is a fungal infection of the skin on your feet. It often occurs on the skin that is between or underneath the toes. It can also occur on the soles of your feet. The infection can spread from person to person (is contagious). It can also spread when  a person's bare feet come in  contact with the fungus on shower floors or on items such as shoes. What are the causes? This condition is caused by a fungus that grows in warm, moist places. You can get athlete's foot by sharing shoes, shower stalls, towels, and wet floors with someone who is infected. Not washing your feet or changing your socks often enough can also lead to athlete's foot. What increases the risk? This condition is more likely to develop in:  Men.  People who have a weak body defense system (immune system).  People who have diabetes.  People who use public showers, such as at a gym.  People who wear heavy-duty shoes, such as Environmental manager.  Seasons with warm, humid weather. What are the signs or symptoms? Symptoms of this condition include:  Itchy areas between your toes or on the soles of your feet.  White, flaky, or scaly areas between your toes or on the soles of your feet.  Very itchy small blisters between your toes or on the soles of your feet.  Small cuts in your skin. These cuts can become infected.  Thick or discolored toenails. How is this diagnosed? This condition may be diagnosed with a physical exam and a review of your medical history. Your health care provider may also take a skin or toenail sample to examine under a microscope. How is this treated? This condition is treated with antifungal medicines. These may be applied as powders, ointments, or creams. In severe cases, an oral antifungal medicine may be given. Follow these instructions at home: Medicines  Apply or take over-the-counter and prescription medicines only as told by your health care provider.  Apply your antifungal medicine as told by your health care provider. Do not stop using the antifungal even if your condition improves. Foot care  Do not scratch your feet.  Keep your feet dry: ? Wear cotton or wool socks. Change your socks every day or if they become wet. ? Wear shoes that allow air  to flow, such as sandals or canvas tennis shoes.  Wash and dry your feet, including the area between your toes. Also, wash and dry your feet: ? Every day or as told by your health care provider. ? After exercising. General instructions  Do not let others use towels, shoes, nail clippers, or other personal items that touch your feet.  Protect your feet by wearing sandals in wet areas, such as locker rooms and shared showers.  Keep all follow-up visits as told by your health care provider. This is important.  If you have diabetes, keep your blood sugar under control. Contact a health care provider if:  You have a fever.  You have swelling, soreness, warmth, or redness in your foot.  Your feet are not getting better with treatment.  Your symptoms get worse.  You have new symptoms. Summary  Athlete's foot (tinea pedis) is a fungal infection of the skin on your feet. It often occurs on skin that is between or underneath the toes.  This condition is caused by a fungus that grows in warm, moist places.  Symptoms include white, flaky, or scaly areas between your toes or on the soles of your feet.  This condition is treated with antifungal medicines.  Keep your feet clean. Always dry them thoroughly. This information is not intended to replace advice given to you by your health care provider. Make sure you discuss any questions you have with your health care  provider. Document Revised: 12/14/2016 Document Reviewed: 10/09/2016 Elsevier Patient Education  [employer].    If you have lab work done today you will be contacted with your lab results within the next 2 weeks.  If you have not heard from [place] then please contact us. The fastest way to get your results is to register for My Chart.   IF you received an x-ray today, you will receive an invoice from [facility]iology. Please contact [facility] Radiology at [phone] with questions or concerns regarding your invoice.    IF you received labwork today, you will receive an invoice from [facility]. Please contact [facility] at [phone] with questions or concerns regarding your invoice.   Our billing staff will not be able to assist you with questions regarding bills from these companies.  You will be contacted with the lab results as soon as they are available. The fastest way to get your results is to activate your My Chart account. Instructions are located on the last page of this paperwork. If you have not heard from [place] regarding the results in 2 weeks, please contact this office.         Signed, [name], MD [facility]

## 2019-10-01 ENCOUNTER — Encounter: Payer: Self-pay | Admitting: Family Medicine

## 2019-10-01 NOTE — Telephone Encounter (Signed)
I spoke with Magda Paganini and answered her question regarding vaccines that is needed.

## 2019-10-07 ENCOUNTER — Ambulatory Visit (INDEPENDENT_AMBULATORY_CARE_PROVIDER_SITE_OTHER): Payer: Medicare Other | Admitting: Family Medicine

## 2019-10-07 ENCOUNTER — Other Ambulatory Visit: Payer: Self-pay

## 2019-10-07 DIAGNOSIS — Z23 Encounter for immunization: Secondary | ICD-10-CM

## 2019-10-07 NOTE — Patient Instructions (Signed)
° ° ° °  If you have lab work done today you will be contacted with your lab results within the next 2 weeks.  If you have not heard from us then please contact us. The fastest way to get your results is to register for My Chart. ° ° °IF you received an x-ray today, you will receive an invoice from [facility]. Please contact [facility] at [phone] with questions or concerns regarding your invoice.  ° °IF you received labwork today, you will receive an invoice from [facility]. Please contact [facility] at [phone] with questions or concerns regarding your invoice.  ° °Our billing staff will not be able to assist you with questions regarding bills from these companies. ° °You will be contacted with the lab results as soon as they are available. The fastest way to get your results is to activate your My Chart account. Instructions are located on the last page of this paperwork. If you have not heard from us regarding the results in 2 weeks, please contact this office. °  ° ° ° °

## 2019-11-06 ENCOUNTER — Encounter: Payer: Self-pay | Admitting: Family Medicine

## 2019-11-17 ENCOUNTER — Other Ambulatory Visit: Payer: Self-pay

## 2019-11-17 ENCOUNTER — Ambulatory Visit (INDEPENDENT_AMBULATORY_CARE_PROVIDER_SITE_OTHER): Payer: Medicare Other | Admitting: Family Medicine

## 2019-11-17 DIAGNOSIS — Z6832 Body mass index (BMI) 32.0-32.9, adult: Secondary | ICD-10-CM

## 2019-11-17 DIAGNOSIS — E6609 Other obesity due to excess calories: Secondary | ICD-10-CM

## 2019-11-17 DIAGNOSIS — E221 Hyperprolactinemia: Secondary | ICD-10-CM | POA: Diagnosis not present

## 2019-11-17 NOTE — Patient Instructions (Signed)
Pt came in for lab visit. Blood taken from right arm. Tolerated well

## 2019-11-18 ENCOUNTER — Ambulatory Visit: Payer: Medicare Other

## 2019-11-18 LAB — COMPREHENSIVE METABOLIC PANEL
ALT: 36 IU/L — ABNORMAL HIGH (ref 0–32)
AST: 25 IU/L (ref 0–40)
Albumin/Globulin Ratio: 1.5 (ref 1.2–2.2)
Albumin: 4.1 g/dL (ref 3.9–5.0)
Alkaline Phosphatase: 88 IU/L (ref 44–121)
BUN/Creatinine Ratio: 13 (ref 9–23)
BUN: 11 mg/dL (ref 6–20)
Bilirubin Total: 0.5 mg/dL (ref 0.0–1.2)
CO2: 23 mmol/L (ref 20–29)
Calcium: 9.6 mg/dL (ref 8.7–10.2)
Chloride: 102 mmol/L (ref 96–106)
Creatinine, Ser: 0.84 mg/dL (ref 0.57–1.00)
GFR calc Af Amer: 108 mL/min/{1.73_m2} (ref >59)
GFR calc non Af Amer: 94 mL/min/{1.73_m2} (ref >59)
Globulin, Total: 2.8 g/dL (ref 1.5–4.5)
Glucose: 93 mg/dL (ref 65–99)
Potassium: 4.6 mmol/L (ref 3.5–5.2)
Sodium: 141 mmol/L (ref 134–144)
Total Protein: 6.9 g/dL (ref 6.0–8.5)

## 2019-11-18 LAB — LIPID PANEL
Chol/HDL Ratio: 2.4 ratio (ref 0.0–4.4)
Cholesterol, Total: 115 mg/dL (ref 100–199)
HDL: 47 mg/dL (ref >39)
LDL Chol Calc (NIH): 47 mg/dL (ref 0–99)
Triglycerides: 115 mg/dL (ref 0–149)
VLDL Cholesterol Cal: 21 mg/dL (ref 5–40)

## 2019-11-18 LAB — TSH: TSH: 3.71 u[IU]/mL (ref 0.450–4.500)

## 2019-11-20 ENCOUNTER — Ambulatory Visit (INDEPENDENT_AMBULATORY_CARE_PROVIDER_SITE_OTHER): Payer: Medicare Other | Admitting: Family Medicine

## 2019-11-20 ENCOUNTER — Other Ambulatory Visit: Payer: Self-pay

## 2019-11-20 ENCOUNTER — Encounter: Payer: Self-pay | Admitting: Family Medicine

## 2019-11-20 VITALS — BP 114/76 | HR 99 | Temp 98.5°F | Ht 63.0 in | Wt 186.0 lb

## 2019-11-20 DIAGNOSIS — D229 Melanocytic nevi, unspecified: Secondary | ICD-10-CM

## 2019-11-20 DIAGNOSIS — Z0001 Encounter for general adult medical examination with abnormal findings: Secondary | ICD-10-CM

## 2019-11-20 DIAGNOSIS — J309 Allergic rhinitis, unspecified: Secondary | ICD-10-CM

## 2019-11-20 DIAGNOSIS — B353 Tinea pedis: Secondary | ICD-10-CM

## 2019-11-20 DIAGNOSIS — Z6832 Body mass index (BMI) 32.0-32.9, adult: Secondary | ICD-10-CM

## 2019-11-20 DIAGNOSIS — Z0000 Encounter for general adult medical examination without abnormal findings: Secondary | ICD-10-CM

## 2019-11-20 DIAGNOSIS — R7989 Other specified abnormal findings of blood chemistry: Secondary | ICD-10-CM

## 2019-11-20 DIAGNOSIS — L309 Dermatitis, unspecified: Secondary | ICD-10-CM

## 2019-11-20 DIAGNOSIS — F70 Mild intellectual disabilities: Secondary | ICD-10-CM | POA: Insufficient documentation

## 2019-11-20 MED ORDER — FLUTICASONE PROPIONATE 50 MCG/ACT NA SUSP: 2.0000 | Freq: Every day | NASAL | 1 refills | Status: DC

## 2019-11-20 MED ORDER — CLOTRIMAZOLE-BETAMETHASONE 1-0.05 % EX CREA: 1.0000 "application " | TOPICAL_CREAM | Freq: Two times a day (BID) | CUTANEOUS | 2 refills | Status: DC

## 2019-11-20 NOTE — Patient Instructions (Signed)
Lotrisone cream as needed for eczema/athlete's foot of feet.  Follow-up if any worsening of symptoms. Ears appear clear today. Flonase refilled One elevated liver test but not concerning based on previous readings, recheck levels in 6 months I will refer you to dermatology to look at moles including the one on your left shoulder. Please follow-up to discuss previous back surgeries and concerns regarding your back. Return to the clinic or go to the nearest emergency room if any of your symptoms worsen or new symptoms occur.  Keeping You Healthy  Get These Tests 1. Blood Pressure- Have your blood pressure checked once a year by your health care provider.  Normal blood pressure is 120/80. 2. Weight- Have your body mass index (BMI) calculated to screen for obesity.  BMI is measure of body fat based on height and weight.  You can also calculate your own BMI at GravelBags.it. 3. Cholesterol- Have your cholesterol checked every 5 years starting at age 88 then yearly starting at age 64. 30. Pap Test - Every 1-5 years; discuss with your health care provider. 5. Mammogram- Every 1-2 years starting at age 84--50  Take these medicines  Calcium with Vitamin D-Your body needs 1200 mg of Calcium each day and [phone] IU of Vitamin D daily.  Your body can only absorb 500 mg of Calcium at a time so Calcium must be taken in 2 or 3 divided doses throughout the day.  Multivitamin with folic acid- Once daily if it is possible for you to become pregnant.  Get these Immunizations  Gardasil-Series of three doses; prevents HPV related illness such as genital warts and cervical cancer.  Menactra-Single dose; prevents meningitis.  Tetanus shot- Every 10 years.  Flu shot-Every year.  Take these steps 1. Do not smoke-Your healthcare provider can help you quit.  For tips on how to quit go to www.smokefree.gov or call 1-800 QUITNOW. 2. Be physically active- Exercise 5 days a week for at least 30  minutes.  If you are not already physically active, start slow and gradually work up to 30 minutes of moderate physical activity.  Examples of moderate activity include walking briskly, dancing, swimming, bicycling, etc. 3. Breast Cancer- A self breast exam every month is important for early detection of breast cancer.  For more information and instruction on self breast exams, ask your healthcare provider or https://www.patel.info/. 4. Eat a healthy diet- Eat a variety of healthy foods such as fruits, vegetables, whole grains, low fat milk, low fat cheeses, yogurt, lean meats, poultry and fish, beans, nuts, tofu, etc.  For more information go to www. Thenutritionsource.org 5. Drink alcohol in moderation- Limit alcohol intake to one drink or less per day. Never drink and drive. 6. Depression- Your emotional health is as important as your physical health.  If you're feeling down or losing interest in things you normally enjoy please talk to your healthcare provider about being screened for depression. 7. Dental visit- Brush and floss your teeth twice daily; visit your dentist twice a year. 8. Eye doctor- Get an eye exam at least every 2 years. 9. Helmet use- Always wear a helmet when riding a bicycle, motorcycle, rollerblading or skateboarding. 47. Safe sex- If you may be exposed to sexually transmitted infections, use a condom. 11. Seat belts- Seat belts can save your live; always wear one. 12. Smoke/Carbon Monoxide detectors- These detectors need to be installed on the appropriate level of your home. Replace batteries at least once a year. 13. Skin cancer- When out  in the sun please cover up and use sunscreen 15 SPF or higher. 14. Violence- If anyone is threatening or hurting you, please tell your healthcare provider.

## 2019-11-20 NOTE — Progress Notes (Signed)
Subjective:  Patient ID: Cheryl Dominguez, female    DOB: May 31, 1990  Age: 29 y.o. MRN: [id number]  CC:  Chief Complaint  Patient presents with  . Annual Exam    pt reports she feels well with no complaints. Pt had blood work done on [employer].    HPI Cheryl Dominguez presents for  Annual wellness exam, other concerns below.   Care team OB/GYN, Dr. Delsa Sale with [facility].  History of primary ovarian insufficiency, hyperprolactinemia.  Treated with Microgestin Fe. Psychiatry, Teryl Lucy, NP, will be transitiong to [facility] today.  Mental devt delay, ADD, Treats with Vyvanse, Ativan, Risperdal, Intuniv, Lexapro Ophthalmology, [facility], and hearing, wears hearing aids.  [facility].  Rehabilitative services and day programs from [facility].  [facility].  History of developmental delay.  Allergic rhinitis, treated with over-the-counter loratadine, Flonase. Doing well. Needs refill.     Chronic constipation, treated with MiraLAX daily. Going well.   Eczema, previously treated with betamethasone 0.05% twice daily as needed.treated with antifungals prior. Combo of steroid and antifungal worked better for few weeks. Stopped.  Still some peeling, but not itching. No current treatment.   Borderline LFT on recent labs. ALT 36. Normal 1 year ago, 60 when checked 2 years ago.  Lab Results  Component Value Date   ALT 36 (H) 11/17/2019   AST 25 11/17/2019   ALKPHOS 88 11/17/2019   BILITOT 0.5 11/17/2019    Wt Readings from Last 3 Encounters:  11/20/19 186 lb (84.4 kg)  08/18/19 188 lb (85.3 kg)  06/27/19 191 lb (86.6 kg)  Body mass index is 32.95 kg/m. Walking 6-7 days a week. Wears step counter.  Rare fast food, no sweet tea/soda/juice  Wax in R ear - used debrox about a month ago. Wears hearing aids. Blocked at hearing aid visit a month ago. Hearing ok now.   Question about moles. Has not seen dermatology. No known FH of skin  cancer. "Weird mole" on upper left shoulder, no known changes.   Has 2 rods in back, no recent NS eval. Leaning some to left past year, plans to follow up to discuss.    Fall Risk  11/20/2019 08/18/2019 06/27/2019 12/03/2018 12/03/2018  Falls in the past year? 0 0 0 1 0  Number falls in past yr: - - - 0 0  Injury with Fall? - - - 0 0  Follow up Falls evaluation completed Falls evaluation completed Falls evaluation completed Falls evaluation completed Falls evaluation completed   Depression screen [facility] 2/9 11/20/2019 08/18/2019 06/27/2019 12/03/2018 12/03/2018  Decreased Interest 0 0 0 0 0  Down, Depressed, Hopeless 0 0 0 0 0  PHQ - 2 Score 0 0 0 0 0    Hearing Screening   125Hz  250Hz  500Hz  1000Hz  2000Hz  3000Hz  4000Hz  6000Hz  8000Hz   Right ear:           Left ear:             Visual Acuity Screening   Right eye Left eye Both eyes  Without correction:     With correction: 20/25 20/30 20/30    Immunization History  Administered Date(s) Administered  . HPV Quadrivalent 03/27/2011  . Influenza Split 10/23/2011  . Influenza,inj,Quad PF,6+ Mos 10/29/2010, 11/10/2013, 11/06/2014, 09/01/2015, 10/07/2016, 09/22/2017, 09/19/2018, 10/07/2019  . Influenza-Unspecified 09/16/2012  . PFIZER SARS-COV-2 Vaccination 02/07/2019, 02/28/2019, 11/06/2019  . Tdap 01/25/2011  had covid booster.   Dental: every 6 months.   Exercise:as above.  Functional Status Survey: Is the patient deaf or have difficulty hearing?: Yes (pt ware hearing aid and they work fine.) Does the patient have difficulty seeing, even when wearing glasses/contacts?: No Does the patient have difficulty concentrating, remembering, or making decisions?: No Does the patient have difficulty walking or climbing stairs?: No Does the patient have difficulty dressing or bathing?: No Does the patient have difficulty doing errands alone such as visiting a doctor's office or shopping?: Yes has assistance for visits    6CIT Screen 11/20/2019  12/04/2016  What Year? 4 points 0 points  What month? 0 points 0 points  What time? 3 points 0 points  Count back from 20 0 points 0 points  Months in reverse 0 points 0 points  Repeat phrase 0 points 4 points  Total Score 7 4   Known devt delay as above    History Patient Active Problem List   Diagnosis Date Noted  . Elevated alanine aminotransferase (ALT) level 09/27/2017  . Class 1 obesity due to excess calories without serious comorbidity with body mass index (BMI) of 32.0 to 32.9 in adult 12/01/2015  . Pure hypercholesterolemia 12/01/2015  . Seasonal allergic rhinitis due to pollen 12/01/2014  . Slow transit constipation 12/01/2014  . Primary ovarian insufficiency 12/01/2014  . Hearing loss 12/01/2014  . 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 Hyperprolactinemia ([place]) 08/24/2014  . Seizures ([place]) 06/26/2012  . ADD (attention deficit disorder)   . Kyphosis 01/25/2011  . Mental developmental delay 01/25/2011   Past Medical History:  Diagnosis Date  . ADD (attention deficit disorder)   . ADHD (attention deficit hyperactivity disorder)   . Allergy   . Attention deficit disorder   . Congenital abnormalities   . Constipation   . 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 Hearing loss   . Primary ovarian insufficiency   . Screening for mental disease/developmental disorder    Past Surgical History:  Procedure Laterality Date  . BACK SURGERY     scoliosis; teenage  . SPINE SURGERY N/A    Phreesia 08/16/2019   No Known Allergies Prior to Admission medications   Medication Sig Start Date End Date Taking? Authorizing Provider  Ascorbic Acid (VITAMIN C) 100 MG tablet Take 100 mg by mouth daily.   Yes [provider]  calcium-vitamin D (OSCAL WITH D) 500-200 MG-UNIT tablet Take 1 tablet by mouth 2 (two) times daily. 06/28/16  Yes Wardell Honour, MD  CRANBERRY EXTRACT PO Take by  mouth 2 (two) times daily.   Yes [provider]  escitalopram (LEXAPRO) 20 MG tablet TK 1 T PO QD 02/10/17  Yes [provider]  fluticasone (FLONASE) 50 MCG/ACT nasal spray Place 2 sprays into both nostrils daily. 05/28/18  Yes Delia Chimes A, MD  guanFACINE (INTUNIV) 1 MG TB24 Take by mouth daily.   Yes [provider]  lisdexamfetamine (VYVANSE) 40 MG capsule Take 40 mg by mouth every morning. Reported on 01/17/2015   Yes [provider]  loratadine (CLARITIN) 10 MG tablet Take 1 tablet (10 mg total) by mouth daily. 04/01/17  Yes Wardell Honour, MD  LORazepam (ATIVAN) 1 MG tablet Take 1 mg by mouth daily as needed for anxiety.   Yes [provider]  Multiple Vitamins-Minerals (MULTIVITAMIN PO) Take by mouth 2 (two) times daily.   Yes [provider]  norethindrone-ethinyl estradiol-iron (MICROGESTIN FE,GILDESS FE,LOESTRIN FE) 1.5-30 MG-MCG tablet Take  1 tablet by mouth daily.   Yes [provider]  polyethylene glycol powder (GLYCOLAX/MIRALAX) powder TAKE 16 GRAMS BY MOUTH DAILY 11/26/16  Yes Wardell Honour, MD  Probiotic Product (PROBIOTIC DAILY PO) Take by mouth.   Yes [provider]  risperiDONE (RISPERDAL) 1 MG tablet Take 1 mg by mouth daily. 11/18/16  Yes [provider]  clotrimazole-betamethasone (LOTRISONE) cream Apply 1 application topically 2 (two) times daily. Patient not taking: Reported on 11/20/2019 08/18/19   Wendie Agreste, MD   Social History   Socioeconomic History  . Marital status: Single    Spouse name: Not on file  . Number of children: 0  . Years of education: 1  . Highest education level: High school graduate  Occupational History  . Occupation: disability    Comment: works at [employer]  . Smoking status: Never Smoker  . Smokeless tobacco: Never Used  Vaping Use  . Vaping Use: Never used  Substance and Sexual Activity  . Alcohol use: No  . Drug use: No  .  Sexual activity: Never    Birth control/protection: None  Other Topics Concern  . Not on file  Social History Narrative   Marital status: single; not dating in 2019.  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      Lives: Patient lives with [facility]/[name], [name] (30yo) and [name]/dog.     Education: Patient has 12th grade education. Attends [employer] Monday through Friday 10:00-4:00.      Employment:  Disability.      Education: attends [employer]; M-F 10-4      Tobacco: none      Alcohol: none      Exercise: walking on weekends; some exercise at day program.  Bowls once per week.     Right handed.   Caffeine- Couple times a week.- One   Social Determinants of Health   Financial Resource Strain:   . Difficulty of Paying Living Expenses: Not on file  Food Insecurity:   . Worried About Charity fundraiser in the Last Year: Not on file  . Ran Out of Food in the Last Year: Not on file  Transportation Needs:   . Lack of Transportation (Medical): Not on file  . Lack of Transportation (Non-Medical): Not on file  Physical Activity:   . Days of Exercise per Week: Not on file  . Minutes of Exercise per Session: Not on file  Stress:   . Feeling of Stress : Not on file  Social Connections:   . Frequency of Communication with Friends and Family: Not on file  . Frequency of Social Gatherings with Friends and Family: Not on file  . Attends Religious Services: Not on file  . Active Member of Clubs or Organizations: Not on file  . Attends Archivist Meetings: Not on file  . Marital Status: Not on file  Intimate Partner Violence:   . Fear of Current or Ex-Partner: Not on file  . Emotionally Abused: Not on file  . Physically Abused: Not on file  . Sexually Abused: Not on file    Review of Systems Per HPI.   Objective:   Vitals:   11/20/19 0919  BP: 114/76  Pulse: 99  Temp: 98.5 F (36.9 C)   TempSrc: Temporal  SpO2: 98%  Weight: 186 lb (84.4 kg)  Height: 5\' 3"  (1.6 m)     Physical Exam Constitutional:  Appearance: She is well-developed.  HENT:     Head: Normocephalic and atraumatic.     Right Ear: External ear normal. There is no impacted cerumen.     Left Ear: External ear normal. There is no impacted cerumen.  Eyes:     Conjunctiva/sclera: Conjunctivae normal.     Pupils: Pupils are equal, round, and reactive to light.  Neck:     Thyroid: No thyromegaly.  Cardiovascular:     Rate and Rhythm: Normal rate and regular rhythm.     Heart sounds: Normal heart sounds. No murmur heard.   Pulmonary:     Effort: Pulmonary effort is normal. No respiratory distress.     Breath sounds: Normal breath sounds. No wheezing.  Abdominal:     General: Bowel sounds are normal. There is no distension.     Palpations: Abdomen is soft.     Tenderness: There is no abdominal tenderness. There is no guarding.  Musculoskeletal:     Cervical back: Normal range of motion and neck supple.  Lymphadenopathy:     Cervical: No cervical adenopathy.  Skin:    General: Skin is warm and dry.     Findings: No rash.     Comments: Fair skin, multiple light brown nevi.  Left shoulder with approximately 1 cm slightly raised thickened hyperpigmented area, minimally hyperpigmented.  Bilateral plantar feet with some peeling, dry appearing skin, interdigital areas without cracks or erosions.  No rash, no erythema, no discharge.  Neurological:     Mental Status: She is alert and oriented to person, place, and time.  Psychiatric:        Behavior: Behavior normal.        Thought Content: Thought content normal.        Assessment & Plan:  Cheryl Dominguez is a 29 y.o. female . Encounter for Medicare annual wellness exam  - - anticipatory guidance as below in AVS, screening labs if needed. Health maintenance items as above in HPI discussed/recommended as applicable.  - no concerning responses  on depression, fall, or functional status screening. Any positive responses noted as above.  Tinea pedis of both feet - Plan: clotrimazole-betamethasone (LOTRISONE) cream Chronic eczema of foot - Plan: clotrimazole-betamethasone (LOTRISONE) cream  -Possible component of eczema as well as tinea pedis.  Has had improvement with Lotrisone previously, refilled.  RTC precautions.  Atypical nevus - Plan: Ambulatory referral to Dermatology  -Possible seborrheic keratosis on left shoulder but will refer to dermatology to evaluate this area as well as nevus exam.  Elevated LFTs  -Mild elevation, seen previously.  Repeat in 6 months.  Could be component of hepatic steatosis with overweight/obesity.  BMI 32.0-32.9,adult  -Continue exercise, no concerns with diet at this time.   Allergic rhinitis, unspecified seasonality, unspecified trigger - Plan: fluticasone (FLONASE) 50 MCG/ACT nasal spray  -Stable with Flonase, refilled.  Additional concern of possible shift in spine but this has been present for some time.  Plan separate visit.  I do notice that she has a history of kyphosis on medical history but will review further at planned upcoming visit.  Meds ordered this encounter  Medications  . clotrimazole-betamethasone (LOTRISONE) cream    Sig: Apply 1 application topically 2 (two) times daily.    Dispense:  30 g    Refill:  2  . fluticasone (FLONASE) 50 MCG/ACT nasal spray    Sig: Place 2 sprays into both nostrils daily.    Dispense:  48 g    Refill:  1   Patient Instructions  Lotrisone cream as needed for eczema/athlete's foot of feet.  Follow-up if any worsening of symptoms. Ears appear clear today. Flonase refilled One elevated liver test but not concerning based on previous readings, recheck levels in 6 months I will refer you to dermatology to look at moles including the one on your left shoulder. Please follow-up to discuss previous back surgeries and concerns regarding your  back. Return to the clinic or go to the nearest emergency room if any of your symptoms worsen or new symptoms occur.  Keeping You Healthy  Get These Tests 1. Blood Pressure- Have your blood pressure checked once a year by your health care provider.  Normal blood pressure is 120/80. 2. Weight- Have your body mass index (BMI) calculated to screen for obesity.  BMI is measure of body fat based on height and weight.  You can also calculate your own BMI at GravelBags.it. 3. Cholesterol- Have your cholesterol checked every 5 years starting at age 64 then yearly starting at age 20. 57. Pap Test - Every 1-5 years; discuss with your health care provider. 5. Mammogram- Every 1-2 years starting at age 68--50  Take these medicines  Calcium with Vitamin D-Your body needs 1200 mg of Calcium each day and [phone] IU of Vitamin D daily.  Your body can only absorb 500 mg of Calcium at a time so Calcium must be taken in 2 or 3 divided doses throughout the day.  Multivitamin with folic acid- Once daily if it is possible for you to become pregnant.  Get these Immunizations  Gardasil-Series of three doses; prevents HPV related illness such as genital warts and cervical cancer.  Menactra-Single dose; prevents meningitis.  Tetanus shot- Every 10 years.  Flu shot-Every year.  Take these steps 1. Do not smoke-Your healthcare provider can help you quit.  For tips on how to quit go to www.smokefree.gov or call 1-800 QUITNOW. 2. Be physically active- Exercise 5 days a week for at least 30 minutes.  If you are not already physically active, start slow and gradually work up to 30 minutes of moderate physical activity.  Examples of moderate activity include walking briskly, dancing, swimming, bicycling, etc. 3. Breast Cancer- A self breast exam every month is important for early detection of breast cancer.  For more information and instruction on self breast exams, ask your healthcare provider or  https://www.patel.info/. 4. Eat a healthy diet- Eat a variety of healthy foods such as fruits, vegetables, whole grains, low fat milk, low fat cheeses, yogurt, lean meats, poultry and fish, beans, nuts, tofu, etc.  For more information go to www. Thenutritionsource.org 5. Drink alcohol in moderation- Limit alcohol intake to one drink or less per day. Never drink and drive. 6. Depression- Your emotional health is as important as your physical health.  If you're feeling down or losing interest in things you normally enjoy please talk to your healthcare provider about being screened for depression. 7. Dental visit- Brush and floss your teeth twice daily; visit your dentist twice a year. 8. Eye doctor- Get an eye exam at least every 2 years. 9. Helmet use- Always wear a helmet when riding a bicycle, motorcycle, rollerblading or skateboarding. 53. Safe sex- If you may be exposed to sexually transmitted infections, use a condom. 11. Seat belts- Seat belts can save your live; always wear one. 12. Smoke/Carbon Monoxide detectors- These detectors need to be installed on the appropriate level of your home. Replace batteries at least once  a year. 13. Skin cancer- When out in the sun please cover up and use sunscreen 15 SPF or higher. 14. Violence- If anyone is threatening or hurting you, please tell your healthcare provider.              Signed, [name], MD [facility]

## 2019-12-18 ENCOUNTER — Other Ambulatory Visit: Payer: Self-pay

## 2019-12-18 ENCOUNTER — Ambulatory Visit (INDEPENDENT_AMBULATORY_CARE_PROVIDER_SITE_OTHER): Payer: Medicare Other | Admitting: Family Medicine

## 2019-12-18 ENCOUNTER — Encounter: Payer: Self-pay | Admitting: Family Medicine

## 2019-12-18 ENCOUNTER — Ambulatory Visit (INDEPENDENT_AMBULATORY_CARE_PROVIDER_SITE_OTHER): Payer: Medicare Other

## 2019-12-18 VITALS — BP 107/72 | HR 77 | Temp 99.0°F | Ht 63.0 in | Wt 187.0 lb

## 2019-12-18 DIAGNOSIS — M419 Scoliosis, unspecified: Secondary | ICD-10-CM

## 2019-12-18 NOTE — Patient Instructions (Addendum)
  Continue range of motion, stretches throughout the day.  I will refer you to orthopedics to evaluate your back and let she know if there are any concerns on x-ray.  Potentially could consider some physical therapy but will defer that decision to orthopedics.  Let me know if there are further questions.   If you have lab work done today you will be contacted with your lab results within the next 2 weeks.  If you have not heard from [place] then please contact us. The fastest way to get your results is to register for My Chart.   IF you received an x-ray today, you will receive an invoice from [facility]. Please contact [facility]iology at [phone] with questions or concerns regarding your invoice.   IF you received labwork today, you will receive an invoice from [facility]. Please contact [facility] at [phone] with questions or concerns regarding your invoice.   Our billing staff will not be able to assist you with questions regarding bills from these companies.  You will be contacted with the lab results as soon as they are available. The fastest way to get your results is to activate your My Chart account. Instructions are located on the last page of this paperwork. If you have not heard from [place] regarding the results in 2 weeks, please contact this office.

## 2019-12-18 NOTE — Progress Notes (Addendum)
Subjective:  Patient ID: Cheryl Dominguez, female    DOB: [birth year]  Age: 29 y.o. MRN: [id number]  CC:  Chief Complaint  Patient presents with  . Back concern    Pt has had surgery on her back in the past, but her aid has noticed that the pt has developed a lean to the L side. Aid is concerned that rods in the pt's back are in the right spot or that one leg is longer than the other.    HPI 59 [address] Cheryl Dominguez presents for   Back concern, hx of scoliosis.  Briefly discussed at wellness visit in November.  History of rod placement for scoliosis around 2003. [name] at [facility]. Annual follow up for a few years. Had been doing ok, but over past year has been noticed to have a lean to her left.  No back pain/discomfort/weakness.  No leg weakness. Able to squat on floor, sit on one foot under her without difficulty standing.  With reminders she can stand up straight, notices lean more at rest, or standing waiting in line.  Some home exercises at times.  No new bowel or bladder incontinence, no saddle anesthesia, no lower extremity weakness.   T/L spine XR 03/14/05 at [facility]: COMPARISON: 02/08/04   FINDINGS: Posterior spinal fixation rods in the thoracic and  lumbar spine are in stable position. There is persistent loss of  disc space height at L2-L3 and bony bridging seen at T12/L1. No  fracture or listhesis present.   IMPRESSION: Stable appearance of spinal fixation rods.    History Patient Active Problem List   Diagnosis Date Noted  . Elevated alanine aminotransferase (ALT) level 09/27/2017  . Class 1 obesity due to excess calories without serious comorbidity with body mass index (BMI) of 32.0 to 32.9 in adult 12/01/2015  . Pure hypercholesterolemia 12/01/2015  . Seasonal allergic rhinitis due to pollen 12/01/2014  . Slow transit constipation 12/01/2014  . Primary ovarian insufficiency 12/01/2014  . Hearing loss 12/01/2014  . 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  Hyperprolactinemia ([place]) 08/24/2014  . Seizures ([place]) 06/26/2012  . ADD (attention deficit disorder)   . Kyphosis 01/25/2011  . Mental developmental delay 01/25/2011   Past Medical History:  Diagnosis Date  . ADD (attention deficit disorder)   . ADHD (attention deficit hyperactivity disorder)   . Allergy   . Attention deficit disorder   . Congenital abnormalities   . Constipation   . 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 Hearing loss   . Primary ovarian insufficiency   . Screening for mental disease/developmental disorder    Past Surgical History:  Procedure Laterality Date  . BACK SURGERY     scoliosis; teenage  . SPINE SURGERY N/A    Phreesia 08/16/2019   No Known Allergies Prior to Admission medications   Medication Sig Start Date End Date Taking? Authorizing Provider  Ascorbic Acid (VITAMIN C) 100 MG tablet Take 100 mg by mouth daily.   Yes [provider]  calcium-vitamin D (OSCAL WITH D) 500-200 MG-UNIT tablet Take 1 tablet by mouth 2 (two) times daily. 06/28/16  Yes Wardell Honour, MD  clotrimazole-betamethasone (LOTRISONE) cream Apply 1 application topically 2 (two) times daily. 11/20/19  Yes Wendie Agreste, MD  CRANBERRY EXTRACT PO Take by mouth 2 (two) times daily.   Yes [provider]  escitalopram (LEXAPRO) 20 MG tablet TK 1 T  PO QD 02/10/17  Yes [provider]  fluticasone (FLONASE) 50 MCG/ACT nasal spray Place 2 sprays into both nostrils daily. 11/20/19  Yes Wendie Agreste, MD  guanFACINE (INTUNIV) 1 MG TB24 ER tablet Take by mouth daily.   Yes [provider]  lisdexamfetamine (VYVANSE) 40 MG capsule Take 40 mg by mouth every morning. Reported on 01/17/2015   Yes [provider]  loratadine (CLARITIN) 10 MG tablet Take 1 tablet (10 mg total) by mouth daily. 04/01/17  Yes Wardell Honour, MD  LORazepam (ATIVAN) 1 MG tablet Take  1 mg by mouth daily as needed for anxiety.   Yes [provider]  Multiple Vitamins-Minerals (MULTIVITAMIN PO) Take by mouth 2 (two) times daily.   Yes [provider]  norethindrone-ethinyl estradiol-iron (MICROGESTIN FE,GILDESS FE,LOESTRIN FE) 1.5-30 MG-MCG tablet Take 1 tablet by mouth daily.   Yes [provider]  polyethylene glycol powder (GLYCOLAX/MIRALAX) powder TAKE 16 GRAMS BY MOUTH DAILY 11/26/16  Yes Wardell Honour, MD  Probiotic Product (PROBIOTIC DAILY PO) Take by mouth.   Yes [provider]  risperiDONE (RISPERDAL) 1 MG tablet Take 1 mg by mouth daily. 11/18/16  Yes [provider]   Social History   Socioeconomic History  . Marital status: Single    Spouse name: Not on file  . Number of children: 0  . Years of education: 94  . Highest education level: High school graduate  Occupational History  . Occupation: disability    Comment: works at [employer]  . Smoking status: Never Smoker  . Smokeless tobacco: Never Used  Vaping Use  . Vaping Use: Never used  Substance and Sexual Activity  . Alcohol use: No  . Drug use: No  . Sexual activity: Never    Birth control/protection: None  Other Topics Concern  . Not on file  Social History Narrative   Marital status: single; not dating in 2019.  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      Lives: Patient lives with [facility]/[name], [name] (30yo) and [name]/dog.     Education: Patient has 12th grade education. Attends [employer] Monday through Friday 10:00-4:00.      Employment:  Disability.      Education: attends [employer]; M-F 10-4      Tobacco: none      Alcohol: none      Exercise: walking on weekends; some exercise at day program.  Bowls once per week.     Right handed.   Caffeine- Couple times a week.- One   Social Determinants of Radio broadcast assistant Strain: Not on file   Food Insecurity: Not on file  Transportation Needs: Not on file  Physical Activity: Not on file  Stress: Not on file  Social Connections: Not on file  Intimate Partner Violence: Not on file    Review of Systems   Objective:   Vitals:   12/18/19 1034  BP: 107/72  Pulse: 77  Temp: 99 F (37.2 C)  TempSrc: Temporal  SpO2: 98%  Weight: 187 lb (84.8 kg)  Height: 5\' 3"  (1.6 m)     Physical Exam Vitals reviewed.  Constitutional:      General: She is not in acute distress.    Appearance: She is well-developed and well-nourished.  HENT:     Head: Normocephalic and atraumatic.  Cardiovascular:     Rate and Rhythm: Normal rate.  Pulmonary:  Effort: Pulmonary effort is normal.  Musculoskeletal:     Comments: Thoracic/lumbar spine: Well-healed scar midline from upper thoracic to lower lumbar spine.  No focal bony tenderness, no paraspinal tenderness.  Slight drop of the left shoulder compared to the right with relaxed stance.  Forward flexion appears to have slight prominence of right thoracic paraspinals versus left.  Able to ambulate without difficulty, negative seated straight leg raise, intact lower extremity strength, reflexes 2+ at patella and Achilles bilaterally.  Neurological:     Mental Status: She is alert and oriented to person, place, and time.  Psychiatric:        Mood and Affect: Mood and affect normal.        Assessment & Plan:  SRISHTI STRNAD is a 29 y.o. female . Scoliosis of thoracolumbar spine, unspecified scoliosis type - Plan: DG Lumbar Spine 2-3 Views, DG Thoracic Spine 2 View, Ambulatory referral to Orthopedic Surgery  -May have a component of weakness/deconditioning.  X-ray obtained to evaluate previous rod placement, referral to  Orthopedic spinal surgeon for recheck (prior provider retired - refer locally) May benefit from physical therapy but will have Ortho evaluate first.  No red flags on exam/history.  No orders of the defined types were  placed in this encounter.  Patient Instructions    Continue range of motion, stretches throughout the day.  I will refer you to orthopedics to evaluate your back and let she know if there are any concerns on x-ray.  Potentially could consider some physical therapy but will defer that decision to orthopedics.  Let me know if there are further questions.   If you have lab work done today you will be contacted with your lab results within the next 2 weeks.  If you have not heard from [place] then please contact us. The fastest way to get your results is to register for My Chart.   IF you received an x-ray today, you will receive an invoice from [facility]. Please contact [facility] at [phone] with questions or concerns regarding your invoice.   IF you received labwork today, you will receive an invoice from [facility]. Please contact [facility] at [phone] with questions or concerns regarding your invoice.   Our billing staff will not be able to assist you with questions regarding bills from these companies.  You will be contacted with the lab results as soon as they are available. The fastest way to get your results is to activate your My Chart account. Instructions are located on the last page of this paperwork. If you have not heard from [place] regarding the results in 2 weeks, please contact this office.         Signed, [name], MD [facility]

## 2020-01-15 ENCOUNTER — Encounter: Payer: Self-pay | Admitting: Specialist

## 2020-01-15 ENCOUNTER — Ambulatory Visit (INDEPENDENT_AMBULATORY_CARE_PROVIDER_SITE_OTHER): Payer: Medicare Other | Admitting: Specialist

## 2020-01-15 ENCOUNTER — Other Ambulatory Visit: Payer: Self-pay

## 2020-01-15 VITALS — BP 101/69 | HR 96 | Ht 63.0 in | Wt 187.0 lb

## 2020-01-15 DIAGNOSIS — Q868 Other congenital malformation syndromes due to known exogenous causes: Secondary | ICD-10-CM

## 2020-01-15 DIAGNOSIS — M4325 Fusion of spine, thoracolumbar region: Secondary | ICD-10-CM | POA: Diagnosis not present

## 2020-01-15 DIAGNOSIS — M4014 Other secondary kyphosis, thoracic region: Secondary | ICD-10-CM

## 2020-01-15 NOTE — Patient Instructions (Signed)
Avoid overhead lifting and overhead use of the arms. Do not lift greater than 5 lbs. Adjust head rest in vehicle to prevent hyperextension if rear ended. Take extra precautions to avoid falling. Scapula retraction exercises, dorsal extension. Likelihood of kyphosis increasing is substantial but only at a rate of one degree per year. Surgical corrections involve substantial risk and as high as 98-92% complication rate so I do not Recommend surgery unless she where to become symptomatic or show signs that the increase in Kyphosis is causing nerve conditions.

## 2020-01-15 NOTE — Progress Notes (Signed)
Office Visit Note   Patient: Cheryl Dominguez           Date of Birth: Nov 13, 1990           MRN: [id number] Visit Date: 01/15/2020              Requested by: Wendie Agreste, MD [address] [place],   [address] PCP: Wendie Agreste, MD   Assessment & Plan: Visit Diagnoses:  1. Other secondary kyphosis, thoracic region   2. Fusion of spine of thoracolumbar region   3. Fetal valproate syndrome     Plan: Avoid overhead lifting and overhead use of the arms. Do not lift greater than 5 lbs. Adjust head rest in vehicle to prevent hyperextension if rear ended. Take extra precautions to avoid falling. Scapula retraction exercises, dorsal extension. Likelihood of kyphosis increasing is substantial but only at a rate of one degree per year. Surgical corrections involve substantial risk and as high as 99-24% complication rate so I do not Recommend surgery unless she where to become symptomatic or show signs that the increase in Kyphosis is causing nerve conditions.  Follow-Up Instructions: Return in about 1 year (around 01/14/2021).   Orders:  No orders of the defined types were placed in this encounter.  No orders of the defined types were placed in this encounter.     Procedures: No procedures performed   Clinical Data: No additional findings.   Subjective: Chief Complaint  Patient presents with  . Spine - Scoliosis    30 year old female with history of scoliosis surgery at [facility] by Dr. Osvaldo Human at age 88. She has done well and is seen today with no complaints other than concern that there may be some residual curve of deformity. No bowel or bladder concerns, constipation takes miralax. There is no arm or leg pain numbness or weakness. She can walk one mile, did so yesterday. Has some limitationof ROM of the cervical spine. No hip or foot abnormality, Sister has had scoliosis surgery as well. She was followed by pediatrician for some time and is Now followed by  Dr. Corliss Parish [facility]. Through [employer] She was in the [employer] occupational track. [employer] and [employer] ADDO, auditory processing, Valporic Acid Syndrome, mother was taking at the time of pregnancy.          Review of Systems  Constitutional: Negative.   HENT: Positive for congestion, sinus pressure, sinus pain, sore throat, tinnitus and trouble swallowing.   Eyes: Negative.   Respiratory: Negative.   Cardiovascular: Negative.   Gastrointestinal: Negative.   Endocrine: Negative.   Genitourinary: Negative.  Negative for difficulty urinating, dyspareunia, dysuria, enuresis, flank pain, frequency, genital sores, hematuria, menstrual problem and pelvic pain.  Musculoskeletal: Negative.  Negative for arthralgias, back pain, gait problem, joint swelling, myalgias, neck pain and neck stiffness.  Skin: Positive for rash (eczema on hands where she might pick.).  Neurological: Negative.  Negative for dizziness, tremors, seizures, syncope, facial asymmetry, speech difficulty, weakness, light-headedness, numbness and headaches.  Psychiatric/Behavioral: Negative.      Objective: Vital Signs: BP 101/69 (BP Location: Left Arm, Patient Position: Sitting)   Pulse 96   Ht 5\' 3"  (1.6 m)   Wt 187 lb (84.8 kg)   BMI 33.13 kg/m   Physical Exam Constitutional:      Appearance: She is well-developed and well-nourished.  HENT:     Head: Normocephalic and atraumatic.  Eyes:     Extraocular Movements: EOM  normal.     Pupils: Pupils are equal, round, and reactive to light.  Pulmonary:     Effort: Pulmonary effort is normal.     Breath sounds: Normal breath sounds.  Abdominal:     General: Bowel sounds are normal.     Palpations: Abdomen is soft.  Musculoskeletal:     Cervical back: Normal range of motion and neck supple.     Lumbar back: Negative right straight leg raise test and negative left straight leg raise test.  Skin:    General: Skin is warm and dry.   Neurological:     Mental Status: She is alert and oriented to person, place, and time.  Psychiatric:        Mood and Affect: Mood and affect normal.        Behavior: Behavior normal.        Thought Content: Thought content normal.        Judgment: Judgment normal.     Back Exam   Tenderness  The patient is experiencing tenderness in the lumbar and thoracic.  Range of Motion  Extension: normal  Flexion: normal  Lateral bend right: normal  Lateral bend left: normal  Rotation right: normal  Rotation left: normal   Muscle Strength  Right Quadriceps:  5/5  Left Quadriceps:  5/5  Right Hamstrings:  5/5   Tests  Straight leg raise right: negative Straight leg raise left: negative  Other  Toe walk: normal Heel walk: normal Sensation: normal Gait: normal   Comments:  Forward bending no lumbar curve or hump, minimal left thoracic hump. There is decrease rotation of the cervical spine to the left.       Specialty Comments:  No specialty comments available.  Imaging: No results found.   PMFS History: Patient Active Problem List   Diagnosis Date Noted  . Elevated alanine aminotransferase (ALT) level 09/27/2017  . Class 1 obesity due to excess calories without serious comorbidity with body mass index (BMI) of 32.0 to 32.9 in adult 12/01/2015  . Pure hypercholesterolemia 12/01/2015  . Seasonal allergic rhinitis due to pollen 12/01/2014  . Slow transit constipation 12/01/2014  . Primary ovarian insufficiency 12/01/2014  . Hearing loss 12/01/2014  . 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 Hyperprolactinemia ([place]) 08/24/2014  . Seizures ([place]) 06/26/2012  . ADD (attention deficit disorder)   . Kyphosis 01/25/2011  . Mental developmental delay 01/25/2011   Past Medical History:  Diagnosis Date  . ADD (attention deficit disorder)   . ADHD (attention deficit hyperactivity disorder)   . Allergy   . Attention deficit disorder   . Congenital abnormalities   .  Constipation   . 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 Hearing loss   . Primary ovarian insufficiency   . Screening for mental disease/developmental disorder     Family History  Problem Relation Age of Onset  . Seizures Mother   . Hypothyroidism Mother   . Diabetes Father   . Heart disease Father 35       AMI as cause of death  . COPD Father   . Seizures Sister   . Mental retardation Sister     Past Surgical History:  Procedure Laterality Date  . BACK SURGERY     scoliosis; teenage  . SPINE SURGERY N/A    Phreesia 08/16/2019   Social History   Occupational History  . Occupation: disability  Comment: works at [employer]  . Smoking status: Never Smoker  . Smokeless tobacco: Never Used  Vaping Use  . Vaping Use: Never used  Substance and Sexual Activity  . Alcohol use: No  . Drug use: No  . Sexual activity: Never    Birth control/protection: None

## 2020-05-20 ENCOUNTER — Encounter: Payer: Self-pay | Admitting: Family Medicine

## 2020-05-20 ENCOUNTER — Ambulatory Visit (INDEPENDENT_AMBULATORY_CARE_PROVIDER_SITE_OTHER): Payer: Medicare Other | Admitting: Family Medicine

## 2020-05-20 ENCOUNTER — Other Ambulatory Visit: Payer: Self-pay

## 2020-05-20 VITALS — BP 126/70 | HR 86 | Temp 98.2°F | Resp 16 | Ht 63.0 in | Wt 178.0 lb

## 2020-05-20 DIAGNOSIS — L03011 Cellulitis of right finger: Secondary | ICD-10-CM | POA: Diagnosis not present

## 2020-05-20 DIAGNOSIS — H6123 Impacted cerumen, bilateral: Secondary | ICD-10-CM

## 2020-05-20 DIAGNOSIS — R7989 Other specified abnormal findings of blood chemistry: Secondary | ICD-10-CM | POA: Diagnosis not present

## 2020-05-20 DIAGNOSIS — M419 Scoliosis, unspecified: Secondary | ICD-10-CM

## 2020-05-20 LAB — HEPATIC FUNCTION PANEL
ALT: 37 U/L — ABNORMAL HIGH (ref 0–35)
AST: 21 U/L (ref 0–37)
Albumin: 3.8 g/dL (ref 3.5–5.2)
Alkaline Phosphatase: 68 U/L (ref 39–117)
Bilirubin, Direct: 0.1 mg/dL (ref 0.0–0.3)
Total Bilirubin: 0.4 mg/dL (ref 0.2–1.2)
Total Protein: 6.8 g/dL (ref 6.0–8.3)

## 2020-05-20 MED ORDER — DOXYCYCLINE HYCLATE 100 MG PO TABS
100.0000 mg | ORAL_TABLET | Freq: Two times a day (BID) | ORAL | 0 refills | Status: DC
Start: 2020-05-20 — End: 2021-01-20

## 2020-05-20 NOTE — Patient Instructions (Signed)
Minimal earwax/cerumen in canals today.  I do not recommend any procedures at this time, but you are welcome to try Debrox at home intermittently.  If any blockage or decreased hearing, return for possible procedure in office.  I do think you have a paronychia of the finger but that appears to be significantly improved the past few days with the home treatments.  If not continuing to resolve tomorrow, start doxycycline twice per day for 1 week.  See information below.  I will check your liver function tests but expect those to be better.  Keep up the good work with activity/exercise and watching diet.  Follow-up in 6 months but please let me know if there are questions sooner.  Thank you for coming in today.   Paronychia Paronychia is an infection of the skin that surrounds a nail. It usually affects the skin around a fingernail, but it may also occur near a toenail. It often causes pain and swelling around the nail. In some cases, a collection of pus (abscess) can form near or under the nail.  This condition may develop suddenly, or it may develop gradually over a longer period. In most cases, paronychia is not serious, and it will clear up with treatment. What are the causes? This condition may be caused by bacteria or a fungus. These germs can enter the body through an opening in the skin, such as a cut or a hangnail. What increases the risk? This condition is more likely to develop in people who:  Get their hands wet often, such as those who work as Designer, industrial/product, bartenders, or nurses.  Bite their fingernails or suck their thumbs.  Trim their nails very short.  Have hangnails or injured fingertips.  Get manicures.  Have diabetes. What are the signs or symptoms? Symptoms of this condition include:  Redness and swelling of the skin near the nail.  Tenderness around the nail when you touch the area.  Pus-filled bumps under the skin at the base and sides of the nail  (cuticle).  Fluid or pus under the nail.  Throbbing pain in the area. How is this diagnosed? This condition is diagnosed with a physical exam. In some cases, a sample of pus may be tested to determine what type of bacteria or fungus is causing the condition. How is this treated? Treatment depends on the cause and severity of your condition. If your condition is mild, it may clear up on its own in a few days or after soaking in warm water. If needed, treatment may include:  Antibiotic medicine, if your infection is caused by bacteria.  Antifungal medicine, if your infection is caused by a fungus.  A procedure to drain pus from an abscess.  Anti-inflammatory medicine (corticosteroids). Follow these instructions at home: Wound care  Keep the affected area clean.  Soak the affected area in warm water, if told to do so by your health care provider. You may be told to do this for 20 minutes, 2-3 times a day.  Keep the area dry when you are not soaking it.  Do not try to drain an abscess yourself.  Follow instructions from your health care provider about how to take care of the affected area. Make sure you: ? Wash your hands with soap and water before you change your bandage (dressing). If soap and water are not available, use hand sanitizer. ? Change your dressing as told by your health care provider.  If you had an abscess drained, check the  area every day for signs of infection. Check for: ? Redness, swelling, or pain. ? Fluid or blood. ? Warmth. ? Pus or a bad smell. Medicines  Take over-the-counter and prescription medicines only as told by your health care provider.  If you were prescribed an antibiotic medicine, take it as told by your health care provider. Do not stop taking the antibiotic even if you start to feel better.   General instructions  Avoid contact with harsh chemicals.  Do not pick at the affected area. Prevention  To prevent this condition from  happening again: ? Wear rubber gloves when washing dishes or doing other tasks that require your hands to get wet. ? Wear gloves if your hands might come in contact with cleaners or other chemicals. ? Avoid injuring your nails or fingertips. ? Do not bite your nails or tear hangnails. ? Do not cut your nails very short. ? Do not cut your cuticles. ? Use clean nail clippers or scissors when trimming nails. Contact a health care provider if:  Your symptoms get worse or do not improve with treatment.  You have continued or increased fluid, blood, or pus coming from the affected area.  Your finger or knuckle becomes swollen or difficult to move. Get help right away if you have:  A fever or chills.  Redness spreading away from the affected area.  Joint or muscle pain. Summary  Paronychia is an infection of the skin that surrounds a nail. It often causes pain and swelling around the nail. In some cases, a collection of pus (abscess) can form near or under the nail.  This condition may be caused by bacteria or a fungus. These germs can enter the body through an opening in the skin, such as a cut or a hangnail.  If your condition is mild, it may clear up on its own in a few days. If needed, treatment may include medicine or a procedure to drain pus from an abscess.  To prevent this condition from happening again, wear gloves if doing tasks that require your hands to get wet or to come in contact with chemicals. Also avoid injuring your nails or fingertips. This information is not intended to replace advice given to you by your health care provider. Make sure you discuss any questions you have with your health care provider. Document Revised: 10/15/2019 Document Reviewed: 10/15/2019 Elsevier Patient Education  2021 [employer].

## 2020-05-20 NOTE — Progress Notes (Signed)
Subjective:  Patient ID: Cheryl Dominguez, female    DOB: 1990/08/04  Age: 30 y.o. MRN: [id number]  CC:  Chief Complaint  Patient presents with  . Hand Pain    Pt reports swelling, redness, and pain in the third finger of the RT hand, starting Monday, pt reports picks at her nail beds which has caused this issue before.   . Cerumen Impaction    Pt would like ears checked for wax as she is prone to wax buildup from her hearing aids   . Follow-up    Pt here for 6 month follow up on lab work showing some elevated liver test     HPI Cheryl Dominguez presents for   Right hand pain/finger pain Middle finger of right hand.  Started 3 days ago.  Does pick at the nailbeds at times.  Has had inflammation in the past from same cause. Photo on phone - swelling redness of proximal nail fold - pus expressed after alt water soak, now epsom salt soaks.  No fever/chills.  Tx: bacitracin. Better but still swollen.   History of cerumen impaction Wears hearing aids, has been prone to cerumen buildup in the past.  Has used Debrox in the past. Itches ears at times, hearing ok. No recent debrox. Had cerumen removed with hearing aid recheck in February.    Elevated LFTs Borderline elevated ALT in November.  No alcohol. No n/v/abd pain. Possible component of hepatic steatosis with obesity.  Weight has improved with increased activity, healthier diet - less snacks.  No abd pain/n/v.  Wt Readings from Last 3 Encounters:  05/20/20 178 lb (80.7 kg)  01/15/20 187 lb (84.8 kg)  12/18/19 187 lb (84.8 kg)    Lab Results  Component Value Date   ALT 36 (H) 11/17/2019   AST 25 11/17/2019   ALKPHOS 88 11/17/2019   BILITOT 0.5 11/17/2019   Note reviewed from Dr. Otelia Sergeant in January with secondary kyphosis, fusion of thoracolumbar spine.   Recommended avoiding overhead lifting and lifting greater than 5 pounds.  Avoid hyperextension neck.  scapular retraction exercises, dorsal extension.  Likelihood of kyphosis  increasing is substantial but only at a rate of 1 degree/year and discussed that surgical reactions involve risk so did not recommend surgery unless she became symptomatic or showing signs that the increase in the kyphosis was causing nerve conditions.  1 year follow-up planned.  History Patient Active Problem List   Diagnosis Date Noted  . Elevated alanine aminotransferase (ALT) level 09/27/2017  . Class 1 obesity due to excess calories without serious comorbidity with body mass index (BMI) of 32.0 to 32.9 in adult 12/01/2015  . Pure hypercholesterolemia 12/01/2015  . Seasonal allergic rhinitis due to pollen 12/01/2014  . Slow transit constipation 12/01/2014  . Primary ovarian insufficiency 12/01/2014  . Hearing loss 12/01/2014  . 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 Hyperprolactinemia (HCC) 08/24/2014  . Seizures (HCC) 06/26/2012  . ADD (attention deficit disorder)   . Kyphosis 01/25/2011  . Mental developmental delay 01/25/2011   Past Medical History:  Diagnosis Date  . ADD (attention deficit disorder)   . ADHD (attention deficit hyperactivity disorder)   . Allergy   . Attention deficit disorder   . Congenital abnormalities   . Constipation   . 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 Hearing loss   . Primary ovarian insufficiency   . Screening for mental  disease/developmental disorder    Past Surgical History:  Procedure Laterality Date  . BACK SURGERY     scoliosis; teenage  . SPINE SURGERY N/A    [facility]reesia 08/16/2019   No Known Allergies Prior to Admission medications   Medication Sig Start Date End Date Taking? Authorizing Provider  Ascorbic Acid (VITAMIN C) 100 MG tablet Take 100 mg by mouth daily.   Yes [provider]  calcium-vitamin D (OSCAL WITH D) 500-200 MG-UNIT tablet Take 1 tablet by mouth 2 (two) times daily. 06/28/16  Yes Wardell Honour, MD   clotrimazole-betamethasone (LOTRISONE) cream Apply 1 application topically 2 (two) times daily. 11/20/19  Yes Wendie Agreste, MD  CRANBERRY EXTRACT PO Take by mouth 2 (two) times daily.   Yes [provider]  escitalopram (LEXAPRO) 20 MG tablet TK 1 T PO QD 02/10/17  Yes [provider]  fluticasone (FLONASE) 50 MCG/ACT nasal spray Place 2 sprays into both nostrils daily. 11/20/19  Yes Wendie Agreste, MD  guanFACINE (INTUNIV) 1 MG TB24 ER tablet Take by mouth daily.   Yes [provider]  lisdexamfetamine (VYVANSE) 40 MG capsule Take 40 mg by mouth every morning. Reported on 01/17/2015   Yes [provider]  loratadine (CLARITIN) 10 MG tablet Take 1 tablet (10 mg total) by mouth daily. 04/01/17  Yes Wardell Honour, MD  LORazepam (ATIVAN) 1 MG tablet Take 1 mg by mouth daily as needed for anxiety.   Yes [provider]  Multiple Vitamins-Minerals (MULTIVITAMIN PO) Take by mouth 2 (two) times daily.   Yes [provider]  norethindrone-ethinyl estradiol-iron (MICROGESTIN FE,GILDESS FE,LOESTRIN FE) 1.5-30 MG-MCG tablet Take 1 tablet by mouth daily.   Yes [provider]  polyethylene glycol powder (GLYCOLAX/MIRALAX) powder TAKE 16 GRAMS BY MOUTH DAILY 11/26/16  Yes Wardell Honour, MD  Probiotic Product (PROBIOTIC DAILY PO) Take by mouth.   Yes [provider]  risperiDONE (RISPERDAL) 1 MG tablet Take 1 mg by mouth daily. 11/18/16  Yes [provider]   Social History   Socioeconomic History  . Marital status: Single    Spouse name: Not on file  . Number of children: 0  . Years of education: 63  . Highest education level: High school graduate  Occupational History  . Occupation: disability    Comment: works at [employer]  . Smoking status: Never Smoker  . Smokeless tobacco: Never Used  Vaping Use  . Vaping Use: Never used  Substance and Sexual Activity  . Alcohol use: No  . Drug use: No   . Sexual activity: Never    Birth control/protection: None  Other Topics Concern  . Not on file  Social History Narrative   Marital status: single; not dating in 2019.  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      Lives: Patient lives with [facility]/[name], [name] (31yo) and [name]/dog.     Education: Patient has 12th grade education. Attends [employer] Monday through Friday 10:00-4:00.      Employment:  Disability.      Education: attends [employer]; M-F 10-4      Tobacco: none      Alcohol: none      Exercise: walking on weekends; some exercise at day program.  Bowls once per week.     Right handed.   Caffeine- Couple times a week.- One   Social Determinants of Health   Financial Resource Strain:  Not on file  Food Insecurity: Not on file  Transportation Needs: Not on file  Physical Activity: Not on file  Stress: Not on file  Social Connections: Not on file  Intimate Partner Violence: Not on file    Review of Systems Per HPI.   Objective:   Vitals:   05/20/20 0918  BP: 126/70  Pulse: 86  Resp: 16  Temp: 98.2 F (36.8 C)  TempSrc: Temporal  SpO2: 97%  Weight: 178 lb (80.7 kg)  Height: 5\' 3"  (1.6 m)     Physical Exam Vitals reviewed.  Constitutional:      Appearance: She is well-developed.  HENT:     Head: Normocephalic and atraumatic.     Ears:     Comments: Minimal cerumen on right canal, nonobstructive, moderate cerumen in left canal, nonobstructive.  No discharge.  No canal edema, pinna nontender. Eyes:     Conjunctiva/sclera: Conjunctivae normal.     Pupils: Pupils are equal, round, and reactive to light.  Neck:     Vascular: No carotid bruit.  Cardiovascular:     Rate and Rhythm: Normal rate and regular rhythm.     Heart sounds: Normal heart sounds.  Pulmonary:     Effort: Pulmonary effort is normal.     Breath sounds: Normal breath sounds.  Abdominal:     General: There  is no distension.     Palpations: Abdomen is soft. There is no pulsatile mass.     Tenderness: There is no abdominal tenderness.  Skin:    General: Skin is warm and dry.     Comments: R 3rd finger - min edema, erythema of proximal to lateral nailfold. Nontender.   Neurological:     Mental Status: She is alert and oriented to person, place, and time.  Psychiatric:        Behavior: Behavior normal.       Top photo today, bottom photo 3 days ago at home.    30 minutes spent during visit, greater than 50% counseling and assimilation of information, chart review, and discussion of plan.    Assessment & Plan:  Cheryl Dominguez is a 30 y.o. female . Paronychia of finger, right - Plan: doxycycline (VIBRA-TABS) 100 MG tablet  -Improved with home treatment, including with expression of exudate at home.  Minimal erythema/swelling at this time but nontender.  Continue treatment with soaps, and if not resolving in the next 24 to 48 hours can start doxycycline.  Sooner if worsening with RTC precautions given.  Potential side effects and risks of doxycycline discussed, does have beach trip planned this weekend.  Caution with photosensitivity.  Elevated LFTs - Plan: Hepatic function panel  -Anticipate improvement with weight loss, commended on her efforts.  Check labs.  Excessive cerumen in both ear canals  -Minimal to moderate cerumen without obstruction at this time.  Deferred lavage, can try Debrox at home if needed with RTC precautions.  Plan as above regarding scoliosis including home exercises, restrictions and follow-up planned with spine specialist in 1 year.  Meds ordered this encounter  Medications  . doxycycline (VIBRA-TABS) 100 MG tablet    Sig: Take 1 tablet (100 mg total) by mouth 2 (two) times daily.    Dispense:  14 tablet    Refill:  0   Patient Instructions   Minimal earwax/cerumen in canals today.  I do not recommend any procedures at this time, but you are welcome to try  Debrox at home intermittently.  If any blockage  or decreased hearing, return for possible procedure in office.  I do think you have a paronychia of the finger but that appears to be significantly improved the past few days with the home treatments.  If not continuing to resolve tomorrow, start doxycycline twice per day for 1 week.  See information below.  I will check your liver function tests but expect those to be better.  Keep up the good work with activity/exercise and watching diet.  Follow-up in 6 months but please let me know if there are questions sooner.  Thank you for coming in today.   Paronychia Paronychia is an infection of the skin that surrounds a nail. It usually affects the skin around a fingernail, but it may also occur near a toenail. It often causes pain and swelling around the nail. In some cases, a collection of pus (abscess) can form near or under the nail.  This condition may develop suddenly, or it may develop gradually over a longer period. In most cases, paronychia is not serious, and it will clear up with treatment. What are the causes? This condition may be caused by bacteria or a fungus. These germs can enter the body through an opening in the skin, such as a cut or a hangnail. What increases the risk? This condition is more likely to develop in people who:  Get their hands wet often, such as those who work as Designer, industrial/product, bartenders, or nurses.  Bite their fingernails or suck their thumbs.  Trim their nails very short.  Have hangnails or injured fingertips.  Get manicures.  Have diabetes. What are the signs or symptoms? Symptoms of this condition include:  Redness and swelling of the skin near the nail.  Tenderness around the nail when you touch the area.  Pus-filled bumps under the skin at the base and sides of the nail (cuticle).  Fluid or pus under the nail.  Throbbing pain in the area. How is this diagnosed? This condition is diagnosed with a  physical exam. In some cases, a sample of pus may be tested to determine what type of bacteria or fungus is causing the condition. How is this treated? Treatment depends on the cause and severity of your condition. If your condition is mild, it may clear up on its own in a few days or after soaking in warm water. If needed, treatment may include:  Antibiotic medicine, if your infection is caused by bacteria.  Antifungal medicine, if your infection is caused by a fungus.  A procedure to drain pus from an abscess.  Anti-inflammatory medicine (corticosteroids). Follow these instructions at home: Wound care  Keep the affected area clean.  Soak the affected area in warm water, if told to do so by your health care provider. You may be told to do this for 20 minutes, 2-3 times a day.  Keep the area dry when you are not soaking it.  Do not try to drain an abscess yourself.  Follow instructions from your health care provider about how to take care of the affected area. Make sure you: ? Wash your hands with soap and water before you change your bandage (dressing). If soap and water are not available, use hand sanitizer. ? Change your dressing as told by your health care provider.  If you had an abscess drained, check the area every day for signs of infection. Check for: ? Redness, swelling, or pain. ? Fluid or blood. ? Warmth. ? Pus or a bad smell. Medicines  Take  over-the-counter and prescription medicines only as told by your health care provider.  If you were prescribed an antibiotic medicine, take it as told by your health care provider. Do not stop taking the antibiotic even if you start to feel better.   General instructions  Avoid contact with harsh chemicals.  Do not pick at the affected area. Prevention  To prevent this condition from happening again: ? Wear rubber gloves when washing dishes or doing other tasks that require your hands to get wet. ? Wear gloves if your  hands might come in contact with cleaners or other chemicals. ? Avoid injuring your nails or fingertips. ? Do not bite your nails or tear hangnails. ? Do not cut your nails very short. ? Do not cut your cuticles. ? Use clean nail clippers or scissors when trimming nails. Contact a health care provider if:  Your symptoms get worse or do not improve with treatment.  You have continued or increased fluid, blood, or pus coming from the affected area.  Your finger or knuckle becomes swollen or difficult to move. Get help right away if you have:  A fever or chills.  Redness spreading away from the affected area.  Joint or muscle pain. Summary  Paronychia is an infection of the skin that surrounds a nail. It often causes pain and swelling around the nail. In some cases, a collection of pus (abscess) can form near or under the nail.  This condition may be caused by bacteria or a fungus. These germs can enter the body through an opening in the skin, such as a cut or a hangnail.  If your condition is mild, it may clear up on its own in a few days. If needed, treatment may include medicine or a procedure to drain pus from an abscess.  To prevent this condition from happening again, wear gloves if doing tasks that require your hands to get wet or to come in contact with chemicals. Also avoid injuring your nails or fingertips. This information is not intended to replace advice given to you by your health care provider. Make sure you discuss any questions you have with your health care provider. Document Revised: 10/15/2019 Document Reviewed: 10/15/2019 Elsevier Patient Education  2021 [place].      Signed, [name], MD [facility]

## 2020-08-27 DIAGNOSIS — G2401 Drug induced subacute dyskinesia: Secondary | ICD-10-CM | POA: Insufficient documentation

## 2020-10-14 ENCOUNTER — Other Ambulatory Visit: Payer: Self-pay

## 2020-10-14 ENCOUNTER — Ambulatory Visit (INDEPENDENT_AMBULATORY_CARE_PROVIDER_SITE_OTHER): Payer: Medicare Other | Admitting: Registered Nurse

## 2020-10-14 DIAGNOSIS — Z23 Encounter for immunization: Secondary | ICD-10-CM

## 2020-10-14 NOTE — Progress Notes (Signed)
Patient came in for a nurse visit and received a flu shot in her right deltoid. Patient tolerate the medication well.

## 2020-11-21 ENCOUNTER — Encounter: Payer: Self-pay | Admitting: Family Medicine

## 2020-11-24 ENCOUNTER — Ambulatory Visit: Payer: Medicare Other | Admitting: Family Medicine

## 2021-01-06 ENCOUNTER — Ambulatory Visit (INDEPENDENT_AMBULATORY_CARE_PROVIDER_SITE_OTHER): Payer: Medicare Other | Admitting: Family Medicine

## 2021-01-06 VITALS — BP 118/76 | HR 73 | Temp 98.2°F | Resp 16 | Ht 63.0 in | Wt 178.0 lb

## 2021-01-06 DIAGNOSIS — H6122 Impacted cerumen, left ear: Secondary | ICD-10-CM

## 2021-01-06 DIAGNOSIS — K5909 Other constipation: Secondary | ICD-10-CM | POA: Diagnosis not present

## 2021-01-06 DIAGNOSIS — R7989 Other specified abnormal findings of blood chemistry: Secondary | ICD-10-CM | POA: Diagnosis not present

## 2021-01-06 DIAGNOSIS — J309 Allergic rhinitis, unspecified: Secondary | ICD-10-CM | POA: Diagnosis not present

## 2021-01-06 MED ORDER — POLYETHYLENE GLYCOL 3350 17 GM/SCOOP PO POWD: ORAL | 6 refills | Status: DC

## 2021-01-06 NOTE — Patient Instructions (Addendum)
I do recommend repeat liver tests in next few  months if not done on blood work with your other medical providers.   Minimal earwax on left. Ok to try Debrox over the counter, but if ear is blocked - return for procedure in office.   No change in meds for now.

## 2021-01-06 NOTE — Progress Notes (Signed)
Subjective:  Patient ID: Cheryl Dominguez, female    DOB: 18-Apr-1990  Age: 31 y.o. MRN: [id number]  CC:  Chief Complaint  Patient presents with   Form Completion    Pt mother needs form signed   Allergies    Pt in need of refill flonase   Med Change Request    Pt mother would like the rx strength miralax sent to be covered by insurance      HPI Cheryl Dominguez presents for   History of elevated LFTs, suspected hepatic steatosis Stable on May 19 labs last year compared to November 2021. Weight has been stable.  No abd pain/n/v or yellowing of skin/eyes.  Lab Results  Component Value Date   ALT 37 (H) 05/20/2020   AST 21 05/20/2020   ALKPHOS 68 05/20/2020   BILITOT 0.4 05/20/2020    Allergic rhinitis Treated with Flonase.  Working well - no refill needed at this time.  Form to sign for otc meds used.   Chronic constipation Treated with MiraLAX once per day.   She is followed by OB/GYN for history of primary ovarian insufficiency, hyperprolactinemia, psychiatry with fetal valproate syndrome, mental developmental delay, ADD, audiology with use of hearing aids.  Due for follow up with Dr. Louanne Skye regarding her kyphosis. Plan to schedule.   History Patient Active Problem List   Diagnosis Date Noted   Elevated alanine aminotransferase (ALT) level 09/27/2017   Class 1 obesity due to excess calories without serious comorbidity with body mass index (BMI) of 32.0 to 32.9 in adult 12/01/2015   Pure hypercholesterolemia 12/01/2015   Seasonal allergic rhinitis due to pollen 12/01/2014   Slow transit constipation 12/01/2014   Primary ovarian insufficiency 12/01/2014   Hearing loss 12/01/2014   Fetal valproate syndrome 08/26/2014   Hyperprolactinemia ([place]) 08/24/2014   Seizures ([place]) 06/26/2012   ADD (attention deficit disorder)    Kyphosis 01/25/2011   Mental developmental delay 01/25/2011   Past Medical History:  Diagnosis Date   ADD (attention deficit disorder)    ADHD  (attention deficit hyperactivity disorder)    Allergy    Attention deficit disorder    Congenital abnormalities    Constipation    Developmental delay, moderate    delay in motor skill, language, cognitive development due to fetal valproic acid exposure    Hearing loss    Primary ovarian insufficiency    Screening for mental disease/developmental disorder    Past Surgical History:  Procedure Laterality Date   BACK SURGERY     scoliosis; teenage   SPINE SURGERY N/A    [facility]ia 08/16/2019   No Known Allergies Prior to Admission medications   Medication Sig Start Date End Date Taking? Authorizing Provider  Ascorbic Acid (VITAMIN C) 100 MG tablet Take 100 mg by mouth daily.   Yes [provider]  calcium-vitamin D (OSCAL WITH D) 500-200 MG-UNIT tablet Take 1 tablet by mouth 2 (two) times daily. 06/28/16  Yes Wardell Honour, MD  clotrimazole-betamethasone (LOTRISONE) cream Apply 1 application topically 2 (two) times daily. 11/20/19  Yes Wendie Agreste, MD  CRANBERRY EXTRACT PO Take by mouth 2 (two) times daily.   Yes [provider]  doxycycline (VIBRA-TABS) 100 MG tablet Take 1 tablet (100 mg total) by mouth 2 (two) times daily. 05/20/20  Yes Wendie Agreste, MD  escitalopram (LEXAPRO) 20 MG tablet TK 1 T PO QD 02/10/17  Yes [provider]  fluticasone (FLONASE) 50 MCG/ACT nasal spray Place 2 sprays into  both nostrils daily. 11/20/19  Yes Wendie Agreste, MD  guanFACINE (INTUNIV) 1 MG TB24 ER tablet Take by mouth daily.   Yes [provider]  lisdexamfetamine (VYVANSE) 40 MG capsule Take 40 mg by mouth every morning. Reported on 01/17/2015   Yes [provider]  loratadine (CLARITIN) 10 MG tablet Take 1 tablet (10 mg total) by mouth daily. 04/01/17  Yes Wardell Honour, MD  LORazepam (ATIVAN) 1 MG tablet Take 1 mg by mouth daily as needed for anxiety.   Yes [provider]  Multiple Vitamins-Minerals (MULTIVITAMIN PO) Take by mouth  2 (two) times daily.   Yes [provider]  norethindrone-ethinyl estradiol-iron (MICROGESTIN FE,GILDESS FE,LOESTRIN FE) 1.5-30 MG-MCG tablet Take 1 tablet by mouth daily.   Yes [provider]  polyethylene glycol powder (GLYCOLAX/MIRALAX) powder TAKE 16 GRAMS BY MOUTH DAILY 11/26/16  Yes Wardell Honour, MD  Probiotic Product (PROBIOTIC DAILY PO) Take by mouth.   Yes [provider]  risperiDONE (RISPERDAL) 1 MG tablet Take 1 mg by mouth daily. 11/18/16  Yes [provider]   Social History   Socioeconomic History   Marital status: Single    Spouse name: Not on file   Number of children: 0   Years of education: 12   Highest education level: High school graduate  Occupational History   Occupation: disability    Comment: works at [employer]  Tobacco Use   Smoking status: Never   Smokeless tobacco: Never  Vaping Use   Vaping Use: Never used  Substance and Sexual Activity   Alcohol use: No   Drug use: No   Sexual activity: Never    Birth control/protection: None  Other Topics Concern   Not on file  Social History Narrative   Marital status: single; not dating in 2019.  Caregiver/Cheryl Dominguez; spends weekends.      Lives: Patient lives with [facility]/[name], [name] (32yo) and [name]/dog.     Education: Patient has 12th grade education. Attends [employer] Monday through Friday 10:00-4:00.      Employment:  Disability.      Education: attends [employer]; M-F 10-4      Tobacco: none      Alcohol: none      Exercise: walking on weekends; some exercise at day program.  Bowls once per week.     Right handed.   Caffeine- Couple times a week.- One   Social Determinants of Radio broadcast assistant Strain: Not on file  Food Insecurity: Not on file  Transportation Needs: Not on file  Physical Activity: Not on file  Stress: Not on file  Social Connections: Not on file   Intimate Partner Violence: Not on file    Review of Systems   Objective:   Vitals:   01/06/21 1604  BP: 118/76  Pulse: 73  Resp: 16  Temp: 98.2 F (36.8 C)  TempSrc: Temporal  SpO2: 98%  Weight: 178 lb (80.7 kg)  Height: 5\' 3"  (1.6 m)     Physical Exam Vitals reviewed.  Constitutional:      Appearance: Normal appearance. She is well-developed.  HENT:     Head: Normocephalic and atraumatic.     Right Ear: External ear normal.     Left Ear: External ear normal.     Ears:     Comments: Excess cerumen on left, not impacted. Visualized tm appears normal  Eyes:  Conjunctiva/sclera: Conjunctivae normal.     Pupils: Pupils are equal, round, and reactive to light.  Neck:     Vascular: No carotid bruit.  Cardiovascular:     Rate and Rhythm: Normal rate and regular rhythm.     Heart sounds: Normal heart sounds.  Pulmonary:     Effort: Pulmonary effort is normal.     Breath sounds: Normal breath sounds.  Abdominal:     Palpations: Abdomen is soft. There is no pulsatile mass.     Tenderness: There is no abdominal tenderness.  Musculoskeletal:     Right lower leg: No edema.     Left lower leg: No edema.  Skin:    General: Skin is warm and dry.  Neurological:     Mental Status: She is alert and oriented to person, place, and time.  Psychiatric:        Mood and Affect: Mood normal.        Behavior: Behavior normal.    Assessment & Plan:  RONNETTA CURRINGTON is a 31 y.o. female . Chronic constipation - Plan: polyethylene glycol powder (MIRALAX) 17 GM/SCOOP powder  - continue miralax, rx sent if covered. Stable, controlled.   Allergic rhinitis, unspecified seasonality, unspecified trigger  -Stable with Flonase, continue same.  Okay to refill when needed  Excessive cerumen in left ear canal  -Moderate cerumen on left side, not truly impacted, home treatment with Debrox as option or follow-up for cerumen lavage as needed.  May also be cleaned when  seen by her  audiologist in the next few months if needed.  Elevated LFTs Wt Readings from Last 3 Encounters:  01/06/21 178 lb (80.7 kg)  05/20/20 178 lb (80.7 kg)  01/15/20 187 lb (84.8 kg)  Weights have been stable, no symptoms.  Recommended repeat LFTs next few months with her other blood work at specialists if that is performed or I can place a lab only order.   Meds ordered this encounter  Medications   polyethylene glycol powder (MIRALAX) 17 GM/SCOOP powder    Sig: 17 gram powder in 8 ounce fluid daily.    Dispense:  255 g    Refill:  6   Patient Instructions  I do recommend repeat liver tests in next few  months if not done on blood work with your other medical providers.   Minimal earwax on left. Ok to try Debrox over the counter, but if ear is blocked - return for procedure in office.   No change in meds for now.       Signed,   [name], MD [facility] 01/06/21 6:20 PM

## 2021-01-20 ENCOUNTER — Encounter: Payer: Self-pay | Admitting: Family Medicine

## 2021-01-20 ENCOUNTER — Telehealth (INDEPENDENT_AMBULATORY_CARE_PROVIDER_SITE_OTHER): Payer: Medicare Other | Admitting: Family Medicine

## 2021-01-20 VITALS — Temp 100.6°F

## 2021-01-20 DIAGNOSIS — U071 COVID-19: Secondary | ICD-10-CM | POA: Diagnosis not present

## 2021-01-20 MED ORDER — BENZONATATE 100 MG PO CAPS: ORAL_CAPSULE | ORAL | 0 refills | Status: DC

## 2021-01-20 NOTE — Patient Instructions (Signed)
° °  HOME CARE TIPS:  -I sent the medication(s) we discussed to your pharmacy: Meds ordered this encounter  Medications   benzonatate (TESSALON PERLES) 100 MG capsule    Sig: 1-2 capsules up to twice daily as needed for cough    Dispense:  30 capsule    Refill:  0     -can use tylenol if needed for fevers, aches and pains per instructions  -can use nasal saline a few times per day if you have nasal congestion  -stay hydrated, drink plenty of fluids and eat small healthy meals - avoid dairy  -stay home while sick, except to seek medical care. If you have COVID19, you will likely be contagious for 7-10 days. Flu or Influenza is likely contagious for about 7 days. Other respiratory viral infections remain contagious for 5-10+ days depending on the virus and many other factors. Wear a good mask that fits snugly (such as N95 or KN95) if around others to reduce the risk of transmission.  It was nice to meet you today, and I really hope you are feeling better soon. I help [place] out with telemedicine visits on Tuesdays and Thursdays and am happy to help if you need a follow up virtual visit on those days. Otherwise, if you have any concerns or questions following this visit please schedule a follow up visit with your Primary Care doctor or seek care at a local urgent care clinic to avoid delays in care.    Seek in person care or schedule a follow up video visit promptly if your symptoms worsen, new concerns arise or you are not improving with treatment. Call 911 and/or seek emergency care if your symptoms are severe or life threatening.

## 2021-01-20 NOTE — Progress Notes (Signed)
Virtual Visit via Video Note  I connected with Cheryl Dominguez  on 01/20/21 at 11:00 AM EST by a video enabled telemedicine application and verified that I am speaking with the correct person using two identifiers.  Location patient: [place] Location provider:work or home office Persons participating in the virtual visit: patient, provider, and guardian  I discussed the limitations and requested verbal permission for telemedicine visit. The patient expressed understanding and agreed to proceed.   HPI:  Acute telemedicine visit for Covid19: -Onset: yesterday, test positive today -Symptoms include: mild cough, mild low grade fever -Denies: Cp, SOB, NVD -drinking fluids, good appetite -Pertinent past medical history: see below, never had covid -Pertinent medication allergies:No Known Allergies -COVID-19 vaccine status: vaccinated and had 2 boosters including most recent booster in september Immunization History  Administered Date(s) Administered   HPV Quadrivalent 03/27/2011   Influenza Split 10/23/2011   Influenza,inj,Quad PF,6+ Mos 10/29/2010, 11/10/2013, 11/06/2014, 09/01/2015, 10/07/2016, 09/22/2017, 09/19/2018, 10/07/2019, 10/14/2020   Influenza-Unspecified 09/16/2012   PFIZER(Purple Top)SARS-COV-2 Vaccination 02/07/2019, 02/28/2019, 11/06/2019   Pfizer Covid-19 Vaccine Bivalent Booster 22yrs & up 09/24/2020   Tdap 01/25/2011     ROS: See pertinent positives and negatives per HPI.  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    Hearing loss    Primary ovarian insufficiency    Screening for mental disease/developmental disorder     Past Surgical History:  Procedure Laterality Date   BACK  SURGERY     scoliosis; teenage   SPINE SURGERY N/A    [employer] 08/16/2019     Current Outpatient Medications:    Ascorbic Acid (VITAMIN C) 100 MG tablet, Take 100 mg by mouth daily., Disp: , Rfl:    benzonatate (TESSALON PERLES) 100 MG capsule, 1-2 capsules up to twice daily as needed for cough, Disp: 30 capsule, Rfl: 0   calcium-vitamin D (OSCAL WITH D) 500-200 MG-UNIT tablet, Take 1 tablet by mouth 2 (two) times daily., Disp: 180 tablet, Rfl: 3   clotrimazole-betamethasone (LOTRISONE) cream, Apply 1 application topically 2 (two) times daily., Disp: 30 g, Rfl: 2   CRANBERRY EXTRACT PO, Take by mouth 2 (two) times daily., Disp: , Rfl:    escitalopram (LEXAPRO) 20 MG tablet, TK 1 T PO QD, Disp: , Rfl: 5   fluticasone (FLONASE) 50 MCG/ACT nasal spray, Place 2 sprays into both nostrils daily., Disp: 48 g, Rfl: 1   guanFACINE (INTUNIV) 1 MG TB24 ER tablet, Take by mouth daily., Disp: , Rfl:    lisdexamfetamine (VYVANSE) 40 MG capsule, Take 40 mg by mouth every morning. Reported on 01/17/2015, Disp: , Rfl:    loratadine (CLARITIN) 10 MG tablet, Take 1 tablet (10 mg total) by mouth daily., Disp: 90 tablet, Rfl: 3   LORazepam (ATIVAN) 1 MG tablet, Take 1 mg by mouth daily as needed for anxiety., Disp: , Rfl:    Multiple Vitamins-Minerals (MULTIVITAMIN PO), Take by mouth daily., Disp: , Rfl:    norethindrone-ethinyl estradiol-iron (MICROGESTIN FE,GILDESS FE,LOESTRIN FE) 1.5-30 MG-MCG tablet, Take 1 tablet by mouth daily., Disp: , Rfl:    polyethylene glycol powder (MIRALAX) 17 GM/SCOOP powder, 17 gram powder in 8 ounce fluid daily., Disp: 255 g, Rfl: 6   Probiotic Product (  PROBIOTIC DAILY PO), Take by mouth., Disp: , Rfl:    risperiDONE (RISPERDAL) 1 MG tablet, Take 1 mg by mouth daily., Disp: , Rfl: 5  EXAM:  VITALS per patient if applicable:  GENERAL: alert, oriented, appears well and in no acute distress  HEENT: atraumatic, conjunttiva clear, no obvious abnormalities on inspection of  external nose and ears  NECK: normal movements of the head and neck  LUNGS: on inspection no signs of respiratory distress, breathing rate appears normal, no obvious gross SOB, gasping or wheezing  CV: no obvious cyanosis  MS: moves all visible extremities without noticeable abnormality  PSYCH/NEURO: pleasant and cooperative, no obvious depression or anxiety, speech and thought processing grossly intact  ASSESSMENT AND PLAN:  Discussed the following assessment and plan:  COVID-19   Discussed treatment options ,  potential complications, isolation and precautions for COVID-19.  After lengthy discussion, the patient opted he patient and caregiver opted for cough rx in case needed, symptomatic care per patient instructions and opted against and antiviral. The patient did want a prescription for cough, Tessalon Rx sent.   Work/School slipped offered:  declined Advised to seek prompt virtual visit or in person care if worsening, new symptoms arise, or if is not improving with treatment as expected per our conversation of expected course. Discussed options for follow up care. Did let this patient know that I do telemedicine on Tuesdays and Thursdays for [place] and those are the days I am logged into the system. Advised to schedule follow up visit with PCP, [facility] virtual visits or [facility] if any further questions or concerns to avoid delays in care.   I discussed the assessment and treatment plan with the patient. The patient was provided an opportunity to ask questions and all were answered. The patient agreed with the plan and demonstrated an understanding of the instructions.     Lucretia Kern, DO

## 2021-02-14 DIAGNOSIS — F3489 Other specified persistent mood disorders: Secondary | ICD-10-CM | POA: Insufficient documentation

## 2021-02-21 ENCOUNTER — Encounter: Payer: Self-pay | Admitting: Family Medicine

## 2021-03-02 ENCOUNTER — Encounter: Payer: Self-pay | Admitting: Specialist

## 2021-03-02 ENCOUNTER — Other Ambulatory Visit: Payer: Self-pay

## 2021-03-02 ENCOUNTER — Ambulatory Visit (INDEPENDENT_AMBULATORY_CARE_PROVIDER_SITE_OTHER): Payer: Medicare Other

## 2021-03-02 ENCOUNTER — Ambulatory Visit (INDEPENDENT_AMBULATORY_CARE_PROVIDER_SITE_OTHER): Payer: Medicare Other | Admitting: Specialist

## 2021-03-02 VITALS — BP 118/79 | HR 75 | Ht 63.0 in | Wt 178.0 lb

## 2021-03-02 DIAGNOSIS — M4325 Fusion of spine, thoracolumbar region: Secondary | ICD-10-CM

## 2021-03-02 DIAGNOSIS — M4014 Other secondary kyphosis, thoracic region: Secondary | ICD-10-CM

## 2021-03-02 NOTE — Progress Notes (Signed)
? ?Office Visit Note ?  ?Patient: Cheryl Dominguez           ?Date of Birth: Sep 04, 1990           ?MRN: [id number] ?Visit Date: 03/02/2021 ?             ?Requested by: Wendie Agreste, MD ?[id number] ?[place],   [id number] ?PCP: Wendie Agreste, MD ? ? ?Assessment & Plan: ?Visit Diagnoses:  ?1. Fusion of spine of thoracolumbar region   ?2. Other secondary kyphosis, thoracic region   ? ? ?Plan: Avoid frequent bending and stooping  ?No lifting greater than 10 lbs. ?May use ice or moist heat for pain. ?Weight loss is of benefit. ?Try to work and remind her about posture and staying upright.  ?Exercise is important to improve your indurance and does allow people to function better inspite of back pain. ? ?Follow-Up Instructions: Return in about 6 months (around 09/02/2021).  ? ?Orders:  ?Orders Placed This Encounter  ?Procedures  ? XR Lumbar Spine 2-3 Views  ? XR Thoracic Spine 2 View  ? ?No orders of the defined types were placed in this encounter. ? ? ? ? Procedures: ?No procedures performed ? ? ?Clinical Data: ?No additional findings. ? ? ?Subjective: ?Chief Complaint  ?Patient presents with  ? Spine - Follow-up  ? ? ?31 year right handed female with history of cervicalgia, post ? ?Review of Systems ? ? ?Objective: ?Vital Signs: BP 118/79 (BP Location: Left Arm, Patient Position: Sitting)   Pulse 75   Ht 5\' 3"  (1.6 m)   Wt 178 lb (80.7 kg)   BMI 31.53 kg/m?  ? ?Physical Exam ? ?Ortho Exam ? ?Specialty Comments:  ?No specialty comments available. ? ?Imaging: ?XR Thoracic Spine 2 View ? ?Result Date: 03/02/2021 ?AP and lateral of the thoracic spine shows a moderate degree of kyphosis at the upper end of a long T-L fusion There is spondylosis changes of the lowest cervical segments with facet arthrosis and increased joint sclerosis, no anterior listhesis, no instabiltiy. ' ? ?XR Lumbar Spine 2-3 Views ? ?Result Date: 03/02/2021 ?AP and lateral flexion and extension radiographs show long TL fusion with open L3-4  to L5-S1 no sign of luceny of the fusion site. No acute changes, no listhesis. Min pelvic obliquity.   ? ? ?PMFS History: ?Patient Active Problem List  ? Diagnosis Date Noted  ? Elevated alanine aminotransferase (ALT) level 09/27/2017  ? Class 1 obesity due to excess calories without serious comorbidity with body mass index (BMI) of 32.0 to 32.9 in adult 12/01/2015  ? Pure hypercholesterolemia 12/01/2015  ? Seasonal allergic rhinitis due to pollen 12/01/2014  ? Slow transit constipation 12/01/2014  ? Primary ovarian insufficiency 12/01/2014  ? Hearing loss 12/01/2014  ? Fetal valproate syndrome 08/26/2014  ? Hyperprolactinemia ([place]) 08/24/2014  ? Seizures ([place]) 06/26/2012  ? ADD (attention deficit disorder)   ? Kyphosis 01/25/2011  ? Mental developmental delay 01/25/2011  ? ?Past Medical History:  ?Diagnosis Date  ? ADD (attention deficit disorder)   ? ADHD (attention deficit hyperactivity disorder)   ? Allergy   ? Attention deficit disorder   ? Congenital abnormalities   ? Constipation   ? Developmental delay, moderate   ? delay in motor skill, language, cognitive development due to fetal valproic acid exposure   ? Hearing loss   ? Primary ovarian insufficiency   ? Screening for mental disease/developmental disorder   ?  ?Family History  ?Problem  Relation Age of Onset  ? Seizures Mother   ? Hypothyroidism Mother   ? Diabetes Father   ? Heart disease Father 49  ?     AMI as cause of death  ? COPD Father   ? Seizures Sister   ? Mental retardation Sister   ?  ?Past Surgical History:  ?Procedure Laterality Date  ? BACK SURGERY    ? scoliosis; teenage  ? SPINE SURGERY N/A   ? [employer] 08/16/2019  ? ?Social History  ? ?Occupational History  ? Occupation: disability  ?  Comment: works at [employer]  ?Tobacco Use  ? Smoking status: Never  ? Smokeless tobacco: Never  ?Vaping Use  ? Vaping Use: Never used  ?Substance and Sexual Activity  ? Alcohol use: No  ? Drug use: No  ? Sexual activity: Never  ?  Birth  control/protection: None  ? ? ? ? ? ? ?

## 2021-03-02 NOTE — Patient Instructions (Signed)
Avoid frequent bending and stooping  ?No lifting greater than 10 lbs. ?May use ice or moist heat for pain. ?Weight loss is of benefit. ?Try to work and remind her about posture and staying upright.  ?Exercise is important to improve your indurance and does allow people to function better inspite of back pain. ?  ?

## 2021-04-27 ENCOUNTER — Telehealth: Payer: Self-pay | Admitting: Family Medicine

## 2021-04-27 NOTE — Telephone Encounter (Signed)
Left message for patient to call back and schedule Medicare Annual Wellness Visit (AWV).  ? ?Please offer to do virtually or by telephone.  Left office number and my jabber #[phone]. ? ?Last AWV:11/20/2019 ? ?Please schedule at anytime with Nurse Health Advisor. ?  ?

## 2021-06-02 ENCOUNTER — Ambulatory Visit (INDEPENDENT_AMBULATORY_CARE_PROVIDER_SITE_OTHER): Payer: Medicare Other

## 2021-06-02 VITALS — Ht 63.0 in | Wt 178.0 lb

## 2021-06-02 DIAGNOSIS — H539 Unspecified visual disturbance: Secondary | ICD-10-CM

## 2021-06-02 DIAGNOSIS — Z0000 Encounter for general adult medical examination without abnormal findings: Secondary | ICD-10-CM | POA: Diagnosis not present

## 2021-06-02 DIAGNOSIS — E221 Hyperprolactinemia: Secondary | ICD-10-CM

## 2021-06-02 DIAGNOSIS — Q868 Other congenital malformation syndromes due to known exogenous causes: Secondary | ICD-10-CM | POA: Diagnosis not present

## 2021-06-02 NOTE — Progress Notes (Signed)
Subjective:   Cheryl Dominguez is a 31 y.o. female who presents for Medicare Annual (Subsequent) preventive examination. Virtual Visit via Video Note  I connected with  [place] on 06/02/21 at  9:00 AM EDT via telehealth video enabled device and verified that I am speaking with the correct person using two identifiers.  Location: Patient: HOME Provider: [facility] Persons participating in the virtual visit: patient/Nurse Health Advisor   I discussed the limitations, risks, security and privacy concerns of performing an evaluation and management service by telephone and the availability of in person appointments. The patient expressed understanding and agreed to proceed.  Some vital signs may be absent or patient reported.   Chriss Driver, LPN  Review of Systems     Cardiac Risk Factors include: dyslipidemia;sedentary lifestyle;obesity (BMI >30kg/m2);Other (see comment), Risk factor comments: Seizure disorder     Objective:    Today's Vitals   06/02/21 0901  Weight: 178 lb (80.7 kg)  Height: '5\' 3"'$  (1.6 m)   Body mass index is 31.53 kg/m.     06/02/2021    9:10 AM 12/04/2016    8:33 AM 03/21/2014    9:48 AM 02/19/2014    8:28 AM  Advanced Directives  Does Patient Have a Medical Advance Directive? Yes Yes Yes No  Type of Paramedic of [place];Living will [place];Living will [place] in Chart? Yes - validated most recent copy scanned in chart (See row information) Yes No - copy requested   Would patient like information on creating a medical advance directive?    No - patient declined information    Current Medications (verified) Outpatient Encounter Medications as of 06/02/2021  Medication Sig   Ascorbic Acid (VITAMIN C) 100 MG tablet Take 100 mg by mouth daily.   benzonatate (TESSALON PERLES) 100 MG capsule 1-2 capsules up to twice daily as needed for  cough   calcium-vitamin D (OSCAL WITH D) 500-200 MG-UNIT tablet Take 1 tablet by mouth 2 (two) times daily.   clotrimazole-betamethasone (LOTRISONE) cream Apply 1 application topically 2 (two) times daily.   CRANBERRY EXTRACT PO Take 1 capsule by mouth daily.   escitalopram (LEXAPRO) 20 MG tablet TK 1 T PO QD   fluticasone (FLONASE) 50 MCG/ACT nasal spray Place into the nose.   guanFACINE (INTUNIV) 2 MG TB24 ER tablet Take 2 mg by mouth daily.   lisdexamfetamine (VYVANSE) 40 MG capsule Take 40 mg by mouth every morning. Reported on 01/17/2015   loratadine (CLARITIN) 10 MG tablet Take 1 tablet (10 mg total) by mouth daily.   LORazepam (ATIVAN) 1 MG tablet Take 1 mg by mouth daily as needed for anxiety.   Multiple Vitamins-Minerals (MULTIVITAMIN PO) Take by mouth daily.   norethindrone-ethinyl estradiol-iron (MICROGESTIN FE,GILDESS FE,LOESTRIN FE) 1.5-30 MG-MCG tablet Take 1 tablet by mouth daily.   polyethylene glycol powder (GLYCOLAX/MIRALAX) 17 GM/SCOOP powder Take by mouth.   Probiotic Product (PROBIOTIC DAILY PO) Take by mouth.   risperiDONE (RISPERDAL) 1 MG tablet Take 1 mg by mouth daily.   [DISCONTINUED] loratadine (CLARITIN) 10 MG tablet Take by mouth.   [DISCONTINUED] CRANBERRY EXTRACT PO Take by mouth 2 (two) times daily.   [DISCONTINUED] fluticasone (FLONASE) 50 MCG/ACT nasal spray Place 2 sprays into both nostrils daily.   [DISCONTINUED] guanFACINE (INTUNIV) 1 MG TB24 ER tablet Take by mouth daily.   [DISCONTINUED] LORazepam (ATIVAN) 1 MG tablet Take by mouth.   [  DISCONTINUED] polyethylene glycol powder (MIRALAX) 17 GM/SCOOP powder 17 gram powder in 8 ounce fluid daily.   No facility-administered encounter medications on file as of 06/02/2021.    Allergies (verified) Patient has no known allergies.   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    Hearing loss    Primary ovarian insufficiency    Screening for mental disease/developmental disorder    Past Surgical History:  Procedure Laterality Date   BACK SURGERY     scoliosis; teenage   SPINE SURGERY N/A    [employer]ia 08/16/2019   Family History  Problem Relation Age of Onset   Seizures Mother    Hypothyroidism Mother    Diabetes Father    Heart disease Father 76       AMI as cause of death   COPD Father    Seizures Sister    Mental retardation Sister    Social History   Socioeconomic History   Marital status: Single    Spouse name: Not on file   Number of children: 0   Years of education: 12   Highest education level: High school graduate  Occupational History   Occupation: disability    Comment: works at [employer]  Tobacco Use   Smoking status: Never   Smokeless tobacco: Never  Scientific laboratory technician Use: Never used  Substance and Sexual Activity   Alcohol use: No   Drug use: No   Sexual activity: Never    Birth control/protection: None  Other Topics Concern   Not on file  Social History Narrative   Marital status: single; not dating in 2019.  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      Lives: Patient lives with [facility]/[name], [name] (31yo) and [name]/dog.     Education: Patient has 12th grade education. Attends [employer] Monday through Friday 10:00-4:00.      Employment:  Disability.      Education: attends [employer]; M-F 10-4      Tobacco: none      Alcohol: none      Exercise: walking on weekends; some exercise at day program.  Bowls once per week.     Right handed.   Caffeine- Couple times a week.- One   Social Determinants of Radio broadcast assistant Strain: Low Risk    Difficulty of Paying  Living Expenses: Not hard at all  Food Insecurity: No Food Insecurity   Worried About Charity fundraiser in the Last Year: Never true   Arboriculturist in the Last Year: Never true  Transportation Needs: No Transportation Needs   Lack of Transportation (Medical): No   Lack of Transportation (Non-Medical): No  Physical Activity: Sufficiently Active   Days of Exercise per Week: 5 days   Minutes of Exercise per Session: 30 min  Stress: No Stress Concern Present   Feeling of Stress : Not at all  Social Connections: Moderately Integrated   Frequency of Communication with Friends and Family: More than three times a week   Frequency of Social Gatherings with Friends and Family: More than three times a week  Attends Religious Services: More than 4 times per year   Active Member of Clubs or Organizations: Yes   Attends Archivist Meetings: More than 4 times per year   Marital Status: Never married    Tobacco Counseling Counseling given: Not Answered   Clinical Intake:  Pre-visit preparation completed: Yes  Pain : No/denies pain     BMI - recorded: 31.53 Nutritional Status: BMI > 30  Obese Nutritional Risks: None Diabetes: No  How often do you need to have someone help you when you read instructions, pamphlets, or other written materials from your doctor or pharmacy?: 1 - Never  Diabetic?NO  Interpreter Needed?: No  Information entered by :: mj Maitland Muhlbauer, lpn   Activities of Daily Living    06/02/2021    9:10 AM  In your present state of health, do you have any difficulty performing the following activities:  Hearing? 1  Vision? 0  Difficulty concentrating or making decisions? 0  Walking or climbing stairs? 0  Dressing or bathing? 0  Doing errands, shopping? 0  Preparing Food and eating ? N  Using the Toilet? N  In the past six months, have you accidently leaked urine? N  Do you have problems with loss of bowel control? N  Managing your Medications? N   Managing your Finances? N  Housekeeping or managing your Housekeeping? N    Patient Care Team: Wendie Agreste, MD as PCP - General (Family Medicine) Lahoma Crocker, MD as Consulting Physician (Obstetrics and Gynecology) Lahoma Crocker, MD as Consulting Physician (Obstetrics and Gynecology)  Indicate any recent Medical Services you may have received from other than [facility] providers in the past year (date may be approximate).     Assessment:   This is a routine wellness examination for Ylianna.  Hearing/Vision screen Hearing Screening - Comments:: Hearing aids. Vision Screening - Comments:: Glasses. Dr. Annamaria Boots. 2021. Would like referral to new Opthalmologic.   Dietary issues and exercise activities discussed: Current Exercise Habits: Home exercise routine, Type of exercise: walking;stretching, Time (Minutes): 30, Frequency (Times/Week): 5, Weekly Exercise (Minutes/Week): 150, Intensity: Mild, Exercise limited by: neurologic condition(s);cardiac condition(s)   Goals Addressed             This Visit's Progress    DIET - EAT MORE FRUITS AND VEGETABLES   On track    Patient states that she wants to eat more fruit and vegetables on a daily basis.       Exercise 3x per week (30 min per time)       Continue to exercise 3x per week.        Depression Screen    06/02/2021    9:08 AM 01/06/2021    4:08 PM 05/20/2020    9:20 AM 11/20/2019    9:22 AM 08/18/2019    9:30 AM 06/27/2019    9:11 AM 12/03/2018    8:35 AM  PHQ 2/9 Scores  PHQ - 2 Score 0 0 0 0 0 0 0    Fall Risk    06/02/2021    9:10 AM 01/06/2021    4:07 PM 05/20/2020    9:20 AM 11/20/2019    9:22 AM 08/18/2019    9:30 AM  [place] in the past year? 0 0 0 0 0  Number falls in past yr: 0 0     Injury with Fall? 0 0     Risk for fall due to : No Fall Risks No Fall Risks  Follow up Falls prevention discussed Falls evaluation completed Falls evaluation completed Falls evaluation completed Falls  evaluation completed    [place]:  Any stairs in or around the home? Yes  If so, are there any without handrails? No  Home free of loose throw rugs in walkways, pet beds, electrical cords, etc? Yes  Adequate lighting in your home to reduce risk of falls? Yes   ASSISTIVE DEVICES UTILIZED TO PREVENT FALLS:  Life alert? No  Use of a cane, walker or w/c? No  Grab bars in the bathroom? No  Shower chair or bench in shower? No  Elevated toilet seat or a handicapped toilet? No   TIMED UP AND GO:  Was the test performed? No .  Phone visit.   Cognitive Function: Patient has current diagnosis of cognitive impairment. 06/02/2021. Patient is followed by neurology for ongoing assessment. 06/02/2021.Patient is unable to complete screening 6CIT or MMSE.          11/20/2019   10:24 AM 12/04/2016    8:38 AM  6CIT Screen  What Year? 4 points 0 points  What month? 0 points 0 points  What time? 3 points 0 points  Count back from 20 0 points 0 points  Months in reverse 0 points 0 points  Repeat phrase 0 points 4 points  Total Score 7 points 4 points    Immunizations Immunization History  Administered Date(s) Administered   HPV Quadrivalent 03/27/2011   Influenza Split 10/23/2011   Influenza,inj,Quad PF,6+ Mos 10/29/2010, 11/10/2013, 11/06/2014, 09/01/2015, 10/07/2016, 09/22/2017, 09/19/2018, 10/07/2019, 10/14/2020   Influenza-Unspecified 09/16/2012   PFIZER(Purple Top)SARS-COV-2 Vaccination 02/07/2019, 02/28/2019, 11/06/2019   Pfizer Covid-19 Vaccine Bivalent Booster 79yr & up 09/24/2020   Tdap 01/25/2011    TDAP status: Up to date  Flu Vaccine status: Up to date  Pneumococcal vaccine status: Up to date  Covid-19 vaccine status: Completed vaccines  Qualifies for Shingles Vaccine? No   Zostavax completed No   Shingrix Completed?: No.    Education has been provided regarding the importance of this vaccine. Patient has been advised to call  insurance company to determine out of pocket expense if they have not yet received this vaccine. Advised may also receive vaccine at local pharmacy or Health Dept. Verbalized acceptance and understanding.  Screening Tests Health Maintenance  Topic Date Due   PAP SMEAR-Modifier  07/01/2021 (Originally 03/21/2019)   TETANUS/TDAP  12/30/2021 (Originally 01/24/2021)   Hepatitis C Screening  12/30/2021 (Originally 04/11/2007)   INFLUENZA VACCINE  08/02/2021   COVID-19 Vaccine  Completed   HIV Screening  Completed   HPV VACCINES  Aged Out    Health Maintenance  There are no preventive care reminders to display for this patient.  Colorectal cancer screening: Referral to GI placed NOT DUE UNTIL AGE 31.[name] KitchenPt aware the office will call re: appt.  Mammogram status: Ordered NOT DUE UNTIL AGE 31.[name] KitchenPt provided with contact info and advised to call to schedule appt.   Bone Density status: Ordered NOT DUE UNTIL AGE 31. Pt provided with contact info and advised to call to schedule appt.  Lung Cancer Screening: (Low Dose CT Chest recommended if Age 32-80years, 30 pack-year currently smoking OR have quit w/in 15years.) does not qualify.   Additional Screening:  Hepatitis C Screening: does not qualify;  Vision Screening: Recommended annual ophthalmology exams for early detection of glaucoma and other disorders of the eye. Is the patient up to date with their annual eye exam?  No  Who is the provider or what is the name of the office in which the patient attends annual eye exams? Dr. Annamaria Boots If pt is not established with a provider, would they like to be referred to a provider to establish care? Yes .   Dental Screening: Recommended annual dental exams for proper oral hygiene  Community Resource Referral / Chronic Care Management: CRR required this visit?  No   CCM required this visit?  No      Plan:     I have personally reviewed and noted the following in the patient's chart:   Medical and  social history Use of alcohol, tobacco or illicit drugs  Current medications and supplements including opioid prescriptions.  Functional ability and status Nutritional status Physical activity Advanced directives List of other physicians Hospitalizations, surgeries, and ER visits in previous 12 months Vitals Screenings to include cognitive, depression, and falls Referrals and appointments  In addition, I have reviewed and discussed with patient certain preventive protocols, quality metrics, and best practice recommendations. A written personalized care plan for preventive services as well as general preventive health recommendations were provided to patient.     Chriss Driver, LPN   02/08/8339   Nurse Notes: Visit completed with pt and pt's guardian, Magda Paganini. Pt is up date on vaccines. Pt is in need of Opthalmology referral due to retirement of Dr. Annamaria Boots. Referral placed.

## 2021-06-08 ENCOUNTER — Encounter: Payer: Self-pay | Admitting: Family Medicine

## 2021-06-08 DIAGNOSIS — H9190 Unspecified hearing loss, unspecified ear: Secondary | ICD-10-CM

## 2021-06-09 NOTE — Telephone Encounter (Signed)
Given long standing issue would you be okay referring without an appointment or would you like to still see her to start referral process.

## 2021-08-11 ENCOUNTER — Encounter: Payer: Self-pay | Admitting: Family Medicine

## 2021-08-11 ENCOUNTER — Ambulatory Visit (INDEPENDENT_AMBULATORY_CARE_PROVIDER_SITE_OTHER): Payer: Medicare Other | Admitting: Family Medicine

## 2021-08-11 DIAGNOSIS — S7011XA Contusion of right thigh, initial encounter: Secondary | ICD-10-CM

## 2021-08-11 DIAGNOSIS — S50319A Abrasion of unspecified elbow, initial encounter: Secondary | ICD-10-CM | POA: Diagnosis not present

## 2021-08-11 DIAGNOSIS — S7001XA Contusion of right hip, initial encounter: Secondary | ICD-10-CM
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
## 2021-08-11 NOTE — Patient Instructions (Addendum)
Based on your exam today I do not think you need any x-rays.  The abrasion on the elbow appears to be healing well.  The bruising on the right side of your hip and thigh should also continue to heal.  If you have any pain with walking or change in walking or pain with use of your elbow or other areas, let me know and we can certainly image those areas with x-rays or look into other treatment.Return to the clinic or go to the nearest emergency room if any of your symptoms worsen or new symptoms occur.  Take care.   Contusion A contusion is a deep bruise. Contusions are the result of a blunt injury to tissues and muscle fibers under the skin. The injury causes bleeding under the skin. The skin overlying the contusion may turn blue, purple, or yellow. Minor injuries will give you a painless contusion, but more severe injuries cause contusions that may stay painful and swollen for a few weeks. Follow these instructions at home: Pay attention to any changes in your symptoms. Let your health care provider know about them. Take these actions to relieve your pain. Managing pain, stiffness, and swelling  Use resting, icing, applying pressure (compression), and raising (elevating) the injured area. This is often called the RICE strategy. Rest the injured area. Return to your normal activities as told by your health care provider. Ask your health care provider what activities are safe for you. If directed, put ice on the injured area: Put ice in a plastic bag. Place a towel between your skin and the bag. Leave the ice on for 20 minutes, 2-3 times per day. If directed, apply light compression to the injured area using an elastic bandage. Make sure the bandage is not wrapped too tightly. Remove and reapply the bandage as directed by your health care provider. If possible, raise (elevate) the injured area above the level of your heart while you are sitting or lying down. General instructions Take  over-the-counter and prescription medicines only as told by your health care provider. Keep all follow-up visits as told by your health care provider. This is important. Contact a health care provider if: Your symptoms do not improve after several days of treatment. Your symptoms get worse. You have difficulty moving the injured area. Get help right away if: You have severe pain. You have numbness in a hand or foot. Your hand or foot turns pale or cold. Summary A contusion is a deep bruise. Contusions are the result of a blunt injury to tissues and muscle fibers under the skin. It is treated with rest, ice, compression, and elevation. You may be given over-the-counter medicines for pain. Contact a health care provider if your symptoms do not improve, or get worse. Get help right away if you have severe pain, have numbness, or the area turns pale or cold. This information is not intended to replace advice given to you by your health care provider. Make sure you discuss any questions you have with your health care provider. Document Revised: 11/02/2020 Document Reviewed: 10/14/2020 Elsevier Patient Education  [place].   Abrasion An abrasion is a cut or a scrape on the surface of the skin. An abrasion does not go through all the layers of the skin. It is important to care for an abrasion properly to prevent infection. What are the causes? This condition is caused by rubbing your skin on something or falling on a surface, such as the ground. When your  skin rubs on something, some layers of skin may rub off. What are the signs or symptoms? The main symptom of this condition is a cut or a scrape. The cut or scrape may be bleeding, or it may appear red or pink. If your abrasion was caused by a fall, there may be a bruise under your cut or scrape. How is this diagnosed? An abrasion is diagnosed with a physical exam. How is this treated? Treatment for this condition depends on how  large and deep the abrasion is. In most cases: Your abrasion will be cleaned with water and mild soap. This is done to remove any dirt or debris (such as tiny bits of glass or rock) that may be stuck in your wound. An antibiotic ointment may be applied to your abrasion to help prevent infection. A bandage (dressing) may be placed on your abrasion to keep it clean. You may also need a tetanus shot. Follow these instructions at home: Medicines Take or apply over-the-counter and prescription medicines only as told by your health care provider. If you were prescribed an antibiotic medicine, use it as told by your health care provider. Do not stop using the antibiotic even if you start to feel better. Wound care Clean your wound 1 or 2 times a day, or as told by your health care provider. To do this: Wash your hands for at least 20 seconds with mild soap and water. Do this before and after you clean your wound. Wash your wound with mild soap and water and then rinse off the soap. Pat your wound dry with a clean towel. Do not rub your wound. Keep your dressing clean and dry as told by your health care provider. There are many different ways to close and cover a wound. Follow instructions from your health care provider about caring for your wound and about changing and removing your dressing. You may have to change your dressing one or more times a day, or as directed by your health care provider. Check your wound every day for signs of infection. Check for: Redness, especially a red streak that spreads out from your wound. Swelling or increased pain. Warmth. Blood, fluid, pus, or a bad smell. Managing pain and swelling  If directed, put ice on the injured area. To do this: Put ice in a plastic bag. Place a towel between your skin and the bag. Leave the ice on for 20 minutes, 2-3 times a day. Remove the ice if your skin turns bright red. This is very important. If you cannot feel pain, heat, or  cold, you have a greater risk of damage to the area. If possible, raise (elevate) the injured area above the level of your heart while you are sitting or lying down. General instructions Do not take baths, swim, or use a hot tub until your health care provider approves. Ask your doctor about taking showers or sponge baths. Keep all follow-up visits. This is important. Contact a health care provider if: You received a tetanus shot, and you have swelling, severe pain, redness, or bleeding at your injection site. Your pain is not controlled with medicine. You have a fever. You have any of these signs of infection: Redness, swelling, or more pain around your wound. Warmth coming from your wound. Blood, fluid, pus, or a bad smell coming from your wound. Get help right away if: You have a red streak spreading away from your wound. Summary An abrasion is a cut or a scrape on  the surface of the skin. Care for your abrasion properly to prevent infection. Clean your wound with mild soap and water 1 or 2 times a day or as often as told. Follow instructions from your health care provider about taking medicines and changing your bandage (dressing). Contact your health care provider if you have a fever or if you have redness, swelling, or more pain around your wound. Contact your health care provider if you have warmth, blood, fluid, pus, or a bad smell coming from your wound. Get help right away if there is a red streak spreading away from your wound. This information is not intended to replace advice given to you by your health care provider. Make sure you discuss any questions you have with your health care provider. Document Revised: 03/20/2019 Document Reviewed: 03/20/2019 Elsevier Patient Education  [place].

## 2021-09-08 ENCOUNTER — Ambulatory Visit (INDEPENDENT_AMBULATORY_CARE_PROVIDER_SITE_OTHER): Payer: Medicare Other | Admitting: Specialist

## 2021-09-08 ENCOUNTER — Encounter: Payer: Self-pay | Admitting: Specialist

## 2021-09-08 VITALS — BP 105/70 | HR 70 | Ht 63.0 in | Wt 178.0 lb

## 2021-09-08 DIAGNOSIS — M4014 Other secondary kyphosis, thoracic region: Secondary | ICD-10-CM

## 2021-09-08 NOTE — Patient Instructions (Signed)
There is a kyphotic deformity of over 50 degrees ( 60 degrees today ) this is abnormal and is occurring in the area at the upper part of her thoracic spine. Exercises help with posture but bone and disc changes relate to the position of the spine and may progress. Recommend follow up in one year to assess for progression. Scapular retraction exercises and extension exercises may be beneficial in allowing her to cope or adapt to the kyphosis that is present. Bracing is not of benefit and if the kyphosis progresses over 80 degrees intervention may be necessary. Expected rate of progression is about 1 degree per year.

## 2021-09-08 NOTE — Progress Notes (Signed)
Office Visit Note   Patient: Cheryl Dominguez           Date of Birth: 24-Sep-1990           MRN: [id number] Visit Date: 09/08/2021              Requested by: Wendie Agreste, MD [address] [place],  [place] [address] PCP: Wendie Agreste, MD   Assessment & Plan: Visit Diagnoses:  1. Other secondary kyphosis, thoracic region     Plan: There is a kyphotic deformity of over 50 degrees ( 60 degrees today ) this is abnormal and is occurring in the area at the upper part of her thoracic spine. Exercises help with posture but bone and disc changes relate to the position of the spine and may progress. Recommend follow up in one year to assess for progression. Scapular retraction exercises and extension exercises may be beneficial in allowing her to cope or adapt to the kyphosis that is present. Bracing is not of benefit and if the kyphosis progresses over 80 degrees intervention may be necessary. Expected rate of progression is about 1 degree per ye  Follow-Up Instructions: Return in about 1 year (around 09/09/2022).   Orders:  No orders of the defined types were placed in this encounter.  No orders of the defined types were placed in this encounter.     Procedures: No procedures performed   Clinical Data: No additional findings.   Subjective: Chief Complaint  Patient presents with   Spine - Follow-up    HPI  Review of Systems   Objective: Vital Signs: BP 105/70 (BP Location: Left Arm, Patient Position: Sitting)   Pulse 70   Ht '5\' 3"'$  (1.6 m)   Wt 178 lb (80.7 kg)   BMI 31.53 kg/m   Physical Exam  Ortho Exam  Specialty Comments:  No specialty comments available.  Imaging: No results found.   PMFS History: Patient Active Problem List   Diagnosis Date Noted   Elevated alanine aminotransferase (ALT) level 09/27/2017   Class 1 obesity due to excess calories without serious comorbidity with body mass index (BMI) of 32.0 to 32.9 in adult 12/01/2015    Pure hypercholesterolemia 12/01/2015   Seasonal allergic rhinitis due to pollen 12/01/2014   Slow transit constipation 12/01/2014   Primary ovarian insufficiency 12/01/2014   Hearing loss 12/01/2014   Fetal valproate syndrome 08/26/2014   Hyperprolactinemia ([place]) 08/24/2014   Tongue coating 12/31/2013   Seizures ([place]) 06/26/2012   ADD (attention deficit disorder)    Kyphosis 01/25/2011   Mental developmental delay 01/25/2011   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    Hearing loss    Primary ovarian insufficiency    Screening for mental disease/developmental disorder     Family History  Problem Relation Age of Onset   Seizures Mother    Hypothyroidism Mother    Diabetes Father    Heart disease Father 5       AMI as cause of death   COPD Father    Seizures Sister    Mental retardation Sister     Past Surgical History:  Procedure Laterality Date  BACK SURGERY     scoliosis; teenage   SPINE SURGERY N/A    [employer]rees[employer] 08/16/2019   Social History   Occupational History   Occupation: disability    Comment: works at [employer]  Tobacco Use   Smoking status: Never   Smokeless tobacco: Never  Scientific laboratory technician Use: Never used  Substance and Sexual Activity   Alcohol use: No   Drug use: No   Sexual activity: Never    Birth control/protection: None

## 2021-10-03 ENCOUNTER — Ambulatory Visit (INDEPENDENT_AMBULATORY_CARE_PROVIDER_SITE_OTHER): Payer: Medicare Other | Admitting: Family Medicine

## 2021-10-03 DIAGNOSIS — Z23 Encounter for immunization: Secondary | ICD-10-CM | POA: Diagnosis not present

## 2021-10-03 NOTE — Progress Notes (Signed)
Pt received her flu vaccine today tolerated well

## 2022-01-05 ENCOUNTER — Ambulatory Visit (INDEPENDENT_AMBULATORY_CARE_PROVIDER_SITE_OTHER): Payer: Medicare Other | Admitting: Family Medicine

## 2022-01-05 ENCOUNTER — Encounter: Payer: Self-pay | Admitting: Family Medicine

## 2022-01-05 VITALS — BP 118/60 | HR 80 | Temp 97.6°F | Ht 63.0 in | Wt 180.6 lb

## 2022-01-05 DIAGNOSIS — K5909 Other constipation: Secondary | ICD-10-CM

## 2022-01-05 DIAGNOSIS — R7989 Other specified abnormal findings of blood chemistry: Secondary | ICD-10-CM | POA: Diagnosis not present

## 2022-01-05 DIAGNOSIS — J309 Allergic rhinitis, unspecified: Secondary | ICD-10-CM | POA: Diagnosis not present

## 2022-01-05 DIAGNOSIS — E669 Obesity, unspecified: Secondary | ICD-10-CM

## 2022-01-05 DIAGNOSIS — Z1159 Encounter for screening for other viral diseases: Secondary | ICD-10-CM

## 2022-01-05 LAB — COMPREHENSIVE METABOLIC PANEL
ALT: 22 U/L (ref 0–35)
AST: 19 U/L (ref 0–37)
Albumin: 3.7 g/dL (ref 3.5–5.2)
Alkaline Phosphatase: 67 U/L (ref 39–117)
BUN: 13 mg/dL (ref 6–23)
CO2: 32 mEq/L (ref 19–32)
Calcium: 9.2 mg/dL (ref 8.4–10.5)
Chloride: 102 mEq/L (ref 96–112)
Creatinine, Ser: 0.72 mg/dL (ref 0.40–1.20)
GFR: 110.39 mL/min (ref >60.00)
Glucose, Bld: 82 mg/dL (ref 70–99)
Potassium: 4.3 mEq/L (ref 3.5–5.1)
Sodium: 140 mEq/L (ref 135–145)
Total Bilirubin: 0.3 mg/dL (ref 0.2–1.2)
Total Protein: 6.9 g/dL (ref 6.0–8.3)

## 2022-01-05 MED ORDER — FLUTICASONE PROPIONATE 50 MCG/ACT NA SUSP: 1.0000 | Freq: Every day | NASAL | 11 refills | Status: DC

## 2022-01-05 NOTE — Patient Instructions (Signed)
I will check labs today including blood sugar, liver test, hepatitis C test.  Continue Flonase for allergies, MiraLAX for constipation.  Check with your insurance regarding the tetanus vaccine and see if that is covered either in our office or at the pharmacy.  Let me know if there are questions and take care!

## 2022-01-05 NOTE — Progress Notes (Addendum)
Subjective:  Patient ID: Cheryl Dominguez, female    DOB: [birth year]  Age: 32 y.o. MRN: [id number]  CC:  Chief Complaint  Patient presents with   Allergic Rhinitis    Constipation    HPI Corinthian E Galvan presents for   Medication review.  Followed by OB/GYN for primary ovarian insufficiency, hyperprolactinemia, and followed by psychiatry with fetal valproate syndrome, developmental delay, ADD.  Followed by audiology with use of hearing aids.  Followed by Ortho/Dr. Louanne Skye prior with history of kyphosis.  Outside labs reviewed from March 2023.  Total cholesterol 117, HDL 44, triglycerides 143, LDL 50.  AST 17, ALT 30, CMP normal otherwise.  CBC normal, TSH normal, A1c 5.2.  TSH 2.78 on July 27.  Negative high risk HPV DNA on July 27.  No recent health changes. No new concerns.   Allergic rhinitis Treated with Flonase, working well with daily use. Needs rf.   Chronic constipation Managed with MiraLAX once per day. Doing well. BM daily, no diarrhea.   Elevated LFT As above, decreased from 37 in 2022 to 30 in March.  Hx of obesity, some weight gain with the holidays.  Walling for exercise.  Wt Readings from Last 3 Encounters:  01/05/22 180 lb 9.6 oz (81.9 kg)  09/08/21 178 lb (80.7 kg)  08/11/21 176 lb (79.8 kg)     Lab Results  Component Value Date   ALT 37 (H) 05/20/2020   AST 21 05/20/2020   ALKPHOS 68 05/20/2020   BILITOT 0.4 05/20/2020   Health maintenance: Pap testing with GYN Covid booster in fall at pharmacy.  HPV vaccine - unsure. Not sexually active - will d/w GYN. Tdap in 2013 - they will check with insurance on coverage.     History Patient Active Problem List   Diagnosis Date Noted   Elevated alanine aminotransferase (ALT) level 09/27/2017   Class 1 obesity due to excess calories without serious comorbidity with body mass index (BMI) of 32.0 to 32.9 in adult 12/01/2015   Pure hypercholesterolemia 12/01/2015   Seasonal allergic rhinitis due to pollen  12/01/2014   Slow transit constipation 12/01/2014   Primary ovarian insufficiency 12/01/2014   Hearing loss 12/01/2014   Fetal valproate syndrome 08/26/2014   Hyperprolactinemia ([place]) 08/24/2014   Tongue coating 12/31/2013   Seizures ([place]) 06/26/2012   ADD (attention deficit disorder)    Kyphosis 01/25/2011   Mental developmental delay 01/25/2011   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    Hearing loss    Primary ovarian insufficiency    Screening for mental disease/developmental disorder    Past Surgical History:  Procedure Laterality Date   BACK SURGERY     scoliosis; teenage   SPINE SURGERY N/A    [employer]reesia 08/16/2019   No Known Allergies Prior to Admission medications   Medication Sig Start Date End Date Taking? Authorizing Provider  Ascorbic Acid (VITAMIN C) 100 MG tablet Take 100 mg by mouth daily.   Yes [provider]  calcium-vitamin D (OSCAL WITH D) 500-200 MG-UNIT tablet Take 1 tablet by mouth 2 (two) times daily. 06/28/16  Yes Wardell Honour, MD  CRANBERRY EXTRACT PO Take 1 capsule by mouth  daily.   Yes [provider]  escitalopram (LEXAPRO) 20 MG tablet TK 1 T PO QD 02/10/17  Yes [provider]  fluticasone (FLONASE) 50 MCG/ACT nasal spray Place into the nose. 04/01/17  Yes [provider]  guanFACINE (INTUNIV) 2 MG TB24 ER tablet Take 1 mg by mouth daily. Mother notes this is at 1 mg ER 05/11/21  Yes [provider]  lisdexamfetamine (VYVANSE) 40 MG capsule Take 40 mg by mouth every morning. Reported on 01/17/2015   Yes [provider]  loratadine (CLARITIN) 10 MG tablet Take 1 tablet (10 mg total) by mouth daily.  04/01/17  Yes Wardell Honour, MD  LORazepam (ATIVAN) 1 MG tablet Take 1 mg by mouth daily as needed for anxiety.   Yes [provider]  Multiple Vitamins-Minerals (MULTIVITAMIN PO) Take by mouth daily.   Yes [provider]  norethindrone-ethinyl estradiol-iron (MICROGESTIN FE,GILDESS FE,LOESTRIN FE) 1.5-30 MG-MCG tablet Take 1 tablet by mouth daily.   Yes [provider]  polyethylene glycol powder (GLYCOLAX/MIRALAX) 17 GM/SCOOP powder Take by mouth.   Yes [provider]  Probiotic Product (PROBIOTIC DAILY PO) Take by mouth.   Yes [provider]  risperiDONE (RISPERDAL) 1 MG tablet Take 1 mg by mouth daily. 11/18/16  Yes [provider]   Social History   Socioeconomic History   Marital status: Single    Spouse name: Not on file   Number of children: 0   Years of education: 12   Highest education level: High school graduate  Occupational History   Occupation: disability    Comment: works at [employer]  Tobacco Use   Smoking status: Never   Smokeless tobacco: Never  Vaping Use   Vaping Use: Never used  Substance and Sexual Activity   Alcohol use: No   Drug use: No   Sexual activity: Never    Birth control/protection: None  Other Topics Concern   Not on file  Social History Narrative   Marital status: single; not dating in 2019.  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      Lives: Patient lives with [facility]/[name], [name] (33yo) and [name]/dog.     Education: Patient has 12th grade education. Attends [employer] Monday through Friday 10:00-4:00.      Employment:  Disability.      Education: attends [employer]; M-F 10-4      Tobacco: none      Alcohol: none      Exercise: walking on weekends; some exercise at day program.  Bowls once per week.     Right handed.   Caffeine- Couple times a week.- One   Social Determinants of Health   Financial  Resource Strain: Low Risk  (06/02/2021)   Overall Financial Resource Strain (CARDIA)    Difficulty of Paying Living Expenses: Not hard at all  Food Insecurity: No Food Insecurity (06/02/2021)   Hunger Vital Sign    Worried About Running Out of Food in the Last Year: Never true    Ran Out of Food in the Last Year: Never true  Transportation Needs: No Transportation Needs (06/02/2021)   PRAPARE - Hydrologist (Medical): No    Lack of Transportation (Non-Medical): No  Physical Activity: Sufficiently Active (06/02/2021)   Exercise Vital Sign    Days of Exercise per Week: 5 days    Minutes of Exercise per Session: 30 min  Stress: No Stress Concern  Present (06/02/2021)   [place]    Feeling of Stress : Not at all  Social Connections: Moderately Integrated (06/02/2021)   Social Connection and Isolation Panel [NHANES]    Frequency of Communication with Friends and Family: More than three times a week    Frequency of Social Gatherings with Friends and Family: More than three times a week    Attends Religious Services: More than 4 times per year    Active Member of [employer] or Organizations: Yes    Attends Archivist Meetings: More than 4 times per year    Marital Status: Never married  Intimate Partner Violence: Not At Risk (06/02/2021)   Humiliation, Afraid, Rape, and Kick questionnaire    Fear of Current or Ex-Partner: No    Emotionally Abused: No    Physically Abused: No    Sexually Abused: No    Review of Systems   Objective:   Vitals:   01/05/22 0908  BP: 118/60  Pulse: 80  Temp: 97.6 F (36.4 C)  TempSrc: Temporal  SpO2: 97%  Weight: 180 lb 9.6 oz (81.9 kg)  Height: '5\' 3"'$  (1.6 m)     Physical Exam Vitals reviewed.  Constitutional:      Appearance: Normal appearance. She is well-developed.  HENT:     Head: Normocephalic and atraumatic.     Ears:     Comments: Moderate  cerumen left, minimal on right.  Not completely obstructed.  Reports this is cleaned at her audiologist. Eyes:     Conjunctiva/sclera: Conjunctivae normal.     Pupils: Pupils are equal, round, and reactive to light.  Neck:     Vascular: No carotid bruit.  Cardiovascular:     Rate and Rhythm: Normal rate and regular rhythm.     Heart sounds: Normal heart sounds.  Pulmonary:     Effort: Pulmonary effort is normal.     Breath sounds: Normal breath sounds.  Abdominal:     Palpations: Abdomen is soft. There is no pulsatile mass.     Tenderness: There is no abdominal tenderness.  Musculoskeletal:     Right lower leg: No edema.     Left lower leg: No edema.  Skin:    General: Skin is warm and dry.  Neurological:     Mental Status: She is alert and oriented to person, place, and time.  Psychiatric:        Mood and Affect: Mood normal.        Behavior: Behavior normal.        Assessment & Plan:  LAKINDRA WIBLE is a 32 y.o. female . Allergic rhinitis, unspecified seasonality, unspecified trigger - Plan: fluticasone (FLONASE) 50 MCG/ACT nasal spray  - Stable with Flonase, continue same refilled.  Chronic constipation  -Continue MiraLAX, RTC precautions  Elevated LFTs - Plan: Comprehensive metabolic panel, Hepatitis C antibody Need for hepatitis C screening test - Plan: Hepatitis C antibody Obesity (BMI 30-39.9)  -Elevated LFT, some improvement last year on repeat testing.  Minimal weight gain over the holidays.  Possible hepatic steatosis as contributor.  Continued monitoring of diet recommended including portion sizes, continue walking for exercise, check LFTs on CMP as well as glucose, and hep C antibody.  Other health maintenance discussed,.  Plans to check with her insurance, and HPV vaccine discussion at GYN.  Meds ordered this encounter  Medications   fluticasone (FLONASE) 50 MCG/ACT nasal spray    Sig: Place 1 spray into  both nostrils daily. Place into the nose.     Dispense:  16 g    Refill:  11   Patient Instructions  I will check labs today including blood sugar, liver test, hepatitis C test.  Continue Flonase for allergies, MiraLAX for constipation.  Check with your insurance regarding the tetanus vaccine and see if that is covered either in our office or at the pharmacy.  Let me know if there are questions and take care!    Signed,   [name], MD [place] 01/05/22 10:21 AM

## 2022-01-06 LAB — HEPATITIS C ANTIBODY: Hepatitis C Ab: NONREACTIVE

## 2022-01-10 ENCOUNTER — Telehealth: Payer: Self-pay | Admitting: Family Medicine

## 2022-01-10 NOTE — Telephone Encounter (Signed)
Sorry she is not feeling well.  Mucinex over-the-counter is fine, avoid decongestants.  Drink plenty of fluids.  Option of office visit, but would also recommend repeat COVID testing in 24 to 48 hours as sometimes false negative the first day or so.  Let me know if I can help further.

## 2022-01-10 NOTE — Telephone Encounter (Signed)
Spoke w/ Magda Paganini and she states that the pt is having some congested , coughing , low grade a fever. Magda Paganini is asking what can the pt take OTC since she is on Vyvanse . They did an @ home COVID test today that is negative

## 2022-01-10 NOTE — Telephone Encounter (Signed)
Caller name: JOSLIN DOELL  On DPR?: Yes  Call back number: [phone] (mobile)  Provider they see: Wendie Agreste, MD  Reason for call:  Magda Paganini called stating that patient has a head cold. Magda Paganini wants to know what Perrin can take for this head cold. Patient is currently coughing, sneezing, runny nose and head congestion.

## 2022-01-10 NOTE — Telephone Encounter (Signed)
Spoke to [place] and informed her of Dr Carlota Raspberry message . Advises she can do Mucinex and plenty of fluids advised to retest for COVID in 24 hours due to having a false negative the first day or so . She expressed verbal understanding

## 2022-03-14 ENCOUNTER — Encounter: Payer: Self-pay | Admitting: Family Medicine

## 2022-06-07 ENCOUNTER — Ambulatory Visit (INDEPENDENT_AMBULATORY_CARE_PROVIDER_SITE_OTHER): Payer: Medicare Other | Admitting: *Deleted

## 2022-06-07 DIAGNOSIS — Z0000 Encounter for general adult medical examination without abnormal findings: Secondary | ICD-10-CM | POA: Diagnosis not present

## 2022-06-07 NOTE — Progress Notes (Signed)
Subjective:   Cheryl Dominguez is a 32 y.o. female who presents for Medicare Annual (Subsequent) preventive examination.  I connected with  Cheryl Dominguez assisted in visit) on 06/07/22 by a video enabled telemedicine application and verified that I am speaking with the correct person using two identifiers.   I discussed the limitations of evaluation and management by telemedicine. The patient expressed understanding and agreed to proceed.  Patient location: home  Provider location: telephone/home    Review of Systems     Cardiac Risk Factors include: advanced age (>41men, >18 women);obesity (BMI >30kg/m2)     Objective:    Today's Vitals   There is no height or weight on file to calculate BMI.     06/07/2022    8:08 AM 06/02/2021    9:10 AM 12/04/2016    8:33 AM 03/21/2014    9:48 AM 02/19/2014    8:28 AM  Advanced Directives  Does Patient Have a Medical Advance Directive? Yes Yes Yes Yes No  Type of Estate agent of [employer] Power of [place];Living will Healthcare Power of [place];Living will Healthcare Power of Attorney   Copy of Healthcare Power of Attorney in Chart? Yes - validated most recent copy scanned in chart (See row information) Yes - validated most recent copy scanned in chart (See row information) Yes No - copy requested   Would patient like information on creating a medical advance directive?     No - patient declined information    Current Medications (verified) Outpatient Encounter Medications as of 06/07/2022  Medication Sig   Ascorbic Acid (VITAMIN C) 100 MG tablet Take 100 mg by mouth daily.   calcium-vitamin D (OSCAL WITH D) 500-200 MG-UNIT tablet Take 1 tablet by mouth 2 (two) times daily.   CRANBERRY EXTRACT PO Take 1 capsule by mouth daily.   escitalopram (LEXAPRO) 20 MG tablet TK 1 T PO QD   fluticasone (FLONASE) 50 MCG/ACT nasal spray Place 1 spray into both nostrils daily. Place into the nose.    guanFACINE (INTUNIV) 2 MG TB24 ER tablet Take 1 mg by mouth daily. Mother notes this is at 1 mg ER   lisdexamfetamine (VYVANSE) 40 MG capsule Take 40 mg by mouth every morning. Reported on 01/17/2015   loratadine (CLARITIN) 10 MG tablet Take 1 tablet (10 mg total) by mouth daily.   LORazepam (ATIVAN) 1 MG tablet Take 1 mg by mouth daily as needed for anxiety.   Multiple Vitamins-Minerals (MULTIVITAMIN PO) Take by mouth daily.   norethindrone-ethinyl estradiol-iron (MICROGESTIN FE,GILDESS FE,LOESTRIN FE) 1.5-30 MG-MCG tablet Take 1 tablet by mouth daily.   polyethylene glycol powder (GLYCOLAX/MIRALAX) 17 GM/SCOOP powder Take by mouth.   Probiotic Product (PROBIOTIC DAILY PO) Take by mouth.   risperiDONE (RISPERDAL) 1 MG tablet Take 1 mg by mouth daily.   No facility-administered encounter medications on file as of 06/07/2022.    Allergies (verified) Patient has no known allergies.   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    Hearing loss    Primary ovarian insufficiency    Screening for mental disease/developmental disorder    Past Surgical History:  Procedure Laterality Date  BACK SURGERY     scoliosis; teenage   SPINE SURGERY N/A    [employer]reesia 08/16/2019   Family History  Problem Relation Age of Onset   Seizures Mother    Hypothyroidism Mother    Diabetes Father    Heart disease Father 63       AMI as cause of death   COPD Father    Seizures Sister    Mental retardation Sister    Social History   Socioeconomic History   Marital status: Single    Spouse name: Not on file   Number of children: 0   Years of education: 12   Highest education level: High school graduate   Occupational History   Occupation: disability    Comment: works at [employer]  Tobacco Use   Smoking status: Never   Smokeless tobacco: Never  Building services engineer Use: Never used  Substance and Sexual Activity   Alcohol use: No   Drug use: No   Sexual activity: Never    Birth control/protection: None  Other Topics Concern   Not on file  Social History Narrative   Marital status: single; not dating in 2019.  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      Lives: Patient lives with [facility]/[name], [name] (33yo) and [name]/dog.     Education: Patient has 12th grade education. Attends Day Program [employer] [place] Monday through Friday 10:00-4:00.      Employment:  Disability.      Education: attends [employer]; M-F 10-4      Tobacco: none      Alcohol: none      Exercise: walking on weekends; some exercise at day program.  Bowls once per week.     Right handed.   Caffeine- Couple times a week.- One   Social Determinants of Health   Financial Resource Strain: Low Risk  (06/02/2021)   Overall Financial Resource Strain (CARDIA)    Difficulty of Paying Living Expenses: Not hard at all  Food Insecurity: No Food Insecurity (06/07/2022)   Hunger Vital Sign    Worried About Running Out of Food in the Last Year: Never true    Ran Out of Food in the Last Year: Never true  Transportation Needs: No Transportation Needs (06/02/2021)   PRAPARE - Administrator, Civil Service (Medical): No    Lack of Transportation (Non-Medical): No  Physical Activity: Sufficiently Active (06/07/2022)   Exercise Vital Sign    Days of Exercise per Week: 4 days    Minutes of Exercise per Session: 40 min  Stress: Stress Concern Present (06/07/2022)   [name] of Occupational Health - Occupational Stress Questionnaire    Feeling of Stress : To some extent  Social Connections: Moderately Isolated (06/07/2022)   Social Connection and Isolation  Panel [NHANES]    Frequency of Communication with Friends and Family: More than three times a week    Frequency of Social Gatherings with Friends and Family: More than three times a week    Attends Religious Services: More than 4 times per year    Active Member of [employer] or Organizations: No    Attends Banker Meetings: Never    Marital Status: Never married    Tobacco Counseling Counseling given: Not Answered   Clinical Intake:  Pre-visit preparation completed: Yes  Pain : No/denies pain     Diabetes: No  How often do you need to have someone help  you when you read instructions, pamphlets, or other written materials from your doctor or pharmacy?: 5 - Always  Diabetic?  no  Interpreter Needed?: No  Information entered by :: Remi Haggard LPN   Activities of Daily Living    06/07/2022    8:10 AM  In your present state of health, do you have any difficulty performing the following activities:  Hearing? 1  Vision? 0  Difficulty concentrating or making decisions? 1  Walking or climbing stairs? 0  Dressing or bathing? 0  Doing errands, shopping? 1  Preparing Food and eating ? N  Using the Toilet? N  In the past six months, have you accidently leaked urine? N  Do you have problems with loss of bowel control? N  Managing your Medications? Y  Managing your Finances? Y  Housekeeping or managing your Housekeeping? Y    Patient Care Team: Shade Flood, MD as PCP - General (Family Medicine) Antionette Char, MD as Consulting Physician (Obstetrics and Gynecology) Antionette Char, MD as Consulting Physician (Obstetrics and Gynecology) Antionette Char, MD as Consulting Physician (Obstetrics and Gynecology)  Indicate any recent Medical Services you may have received from other than [facility] providers in the past year (date may be approximate).     Assessment:   This is a routine wellness examination for Nastasia.  Hearing/Vision screen Hearing  Screening - Comments:: Bilateral hearing aids [facility]   Vision Screening - Comments:: [facility] Up to date  Dietary issues and exercise activities discussed: Current Exercise Habits: Home exercise routine, Type of exercise: walking, Time (Minutes): 40, Frequency (Times/Week): 5, Weekly Exercise (Minutes/Week): 200, Intensity: Mild   Goals Addressed             This Visit's Progress    Patient Stated       Learning to cook       Depression Screen    06/07/2022    8:12 AM 01/05/2022    9:07 AM 08/11/2021    3:38 PM 06/02/2021    9:08 AM 01/06/2021    4:08 PM 05/20/2020    9:20 AM 11/20/2019    9:22 AM  PHQ 2/9 Scores  PHQ - 2 Score 0 0 0 0 0 0 0  PHQ- 9 Score 2 0         Fall Risk    06/07/2022    8:05 AM 01/05/2022    9:07 AM 08/11/2021    3:38 PM 06/02/2021    9:10 AM 01/06/2021    4:07 PM  Fall Risk   Falls in the past year? 0 0 1 0 0  Number falls in past yr: 0 0 0 0 0  Injury with Fall? 0 0 1 0 0  Risk for fall due to :  No Fall Risks No Fall Risks No Fall Risks No Fall Risks  Follow up Falls evaluation completed;Education provided;Falls prevention discussed Falls evaluation completed Falls evaluation completed Falls prevention discussed Falls evaluation completed    FALL RISK PREVENTION PERTAINING TO THE HOME:  Any stairs in or around the home? Yes  If so, are there any without handrails? No  Home free of loose throw rugs in walkways, pet beds, electrical cords, etc? Yes  Adequate lighting in your home to reduce risk of falls? Yes   ASSISTIVE DEVICES UTILIZED TO PREVENT FALLS:  Life alert? No  Use of a cane, walker or w/c? No  Grab bars in the bathroom? No  Shower chair or bench in shower? No  Elevated toilet seat or a handicapped toilet? No   TIMED UP AND GO:  Was the test performed? No .    Cognitive Function:        06/07/2022    8:14 AM 11/20/2019   10:24 AM 12/04/2016    8:38 AM  6CIT Screen  What Year? 0 points 4 points 0  points  What month? 0 points 0 points 0 points  What time? 0 points 3 points 0 points  Count back from 20 0 points 0 points 0 points  Months in reverse 0 points 0 points 0 points  Repeat phrase 0 points 0 points 4 points  Total Score 0 points 7 points 4 points    Immunizations Immunization History  Administered Date(s) Administered   HPV Quadrivalent 03/27/2011   Influenza Split 10/23/2011   Influenza,inj,Quad PF,6+ Mos 10/29/2010, 11/10/2013, 11/06/2014, 09/01/2015, 10/07/2016, 09/22/2017, 09/19/2018, 10/07/2019, 10/14/2020, 10/03/2021   Influenza-Unspecified 09/16/2012   PFIZER(Purple Top)SARS-COV-2 Vaccination 02/07/2019, 02/28/2019, 11/06/2019   Pfizer Covid-19 Vaccine Bivalent Booster 33yrs & up 09/24/2020   Tdap 01/25/2011    TDAP status: Due, Education has been provided regarding the importance of this vaccine. Advised may receive this vaccine at local pharmacy or Health Dept. Aware to provide a copy of the vaccination record if obtained from local pharmacy or Health Dept. Verbalized acceptance and understanding.  Flu Vaccine status: Up to date    Covid-19 vaccine status: Information provided on how to obtain vaccines.   Qualifies for Shingles Vaccine? No   Zostavax completed No   Shingrix Completed?: No.    Education has been provided regarding the importance of this vaccine. Patient has been advised to call insurance company to determine out of pocket expense if they have not yet received this vaccine. Advised may also receive vaccine at local pharmacy or Health Dept. Verbalized acceptance and understanding.  Screening Tests Health Maintenance  Topic Date Due   PAP SMEAR-Modifier  03/21/2019   DTaP/Tdap/Td (2 - Td or Tdap) 01/24/2021   COVID-19 Vaccine (5 - 2023-24 season) 09/02/2021   HPV VACCINES (2 - Risk 3-dose series) 08/12/2022 (Originally 04/24/2011)   INFLUENZA VACCINE  08/03/2022   Medicare Annual Wellness (AWV)  06/07/2023   Hepatitis C Screening  Completed    HIV Screening  Completed    Health Maintenance  Health Maintenance Due  Topic Date Due   PAP SMEAR-Modifier  03/21/2019   DTaP/Tdap/Td (2 - Td or Tdap) 01/24/2021   COVID-19 Vaccine (5 - 2023-24 season) 09/02/2021          Lung Cancer Screening: (Low Dose CT Chest recommended if Age 49-80 years, 30 pack-year currently smoking OR have quit w/in 15years.) does not qualify.   Lung Cancer Screening Referral:   Additional Screening:  Hepatitis C Screening: does not qualify; Completed 2024  Vision Screening: Recommended annual ophthalmology exams for early detection of glaucoma and other disorders of the eye. Is the patient up to date with their annual eye exam?  Yes  Who is the provider or what is the name of the office in which the patient attends annual eye exams? [facility] If pt is not established with a provider, would they like to be referred to a provider to establish care? No .   Dental Screening: Recommended annual dental exams for proper oral hygiene  Community Resource Referral / Chronic Care Management: CRR required this visit?  No   CCM required this visit?  No      Plan:  I have personally reviewed and noted the following in the patient's chart:   Medical and social history Use of alcohol, tobacco or illicit drugs  Current medications and supplements including opioid prescriptions. Patient is not currently taking opioid prescriptions. Functional ability and status Nutritional status Physical activity Advanced directives List of other physicians Hospitalizations, surgeries, and ER visits in previous 12 months Vitals Screenings to include cognitive, depression, and falls Referrals and appointments  In addition, I have reviewed and discussed with patient certain preventive protocols, quality metrics, and best practice recommendations. A written personalized care plan for preventive services as well as general preventive health  recommendations were provided to patient.     Remi Haggard, LPN   01/07/1094   Nurse Notes:

## 2022-06-07 NOTE — Patient Instructions (Signed)
Cheryl Dominguez , Thank you for taking time to come for your Medicare Wellness Visit. I appreciate your ongoing commitment to your health goals. Please review the following plan we discussed and let me know if I can assist you in the future.   Screening recommendations/referrals:  Recommended yearly ophthalmology/optometry visit for glaucoma screening and checkup Recommended yearly dental visit for hygiene and checkup  Vaccinations: Influenza vaccine: up to date  Tdap vaccine: Education provided    Advanced directives: on file    Preventive Care Older, Female Preventive care refers to lifestyle choices and visits with your health care provider that can promote health and wellness. What does preventive care include? A yearly physical exam. This is also called an annual well check. Dental exams once or twice a year. Routine eye exams. Ask your health care provider how often you should have your eyes checked. Personal lifestyle choices, including: Daily care of your teeth and gums. Regular physical activity. Eating a healthy diet. Avoiding tobacco and drug use. Limiting alcohol use. Practicing safe sex. Taking low-dose aspirin every day. Taking vitamin and mineral supplements as recommended by your health care provider. What happens during an annual well check? The services and screenings done by your health care provider during your annual well check will depend on your age, overall health, lifestyle risk factors, and family history of disease. Counseling  Your health care provider may ask you questions about your: Alcohol use. Tobacco use. Drug use. Emotional well-being. Home and relationship well-being. Sexual activity. Eating habits. History of falls. Memory and ability to understand (cognition). Work and work Astronomer. Reproductive health. Screening  You may have the following tests or measurements: Height, weight, and BMI. Blood pressure. Lipid and cholesterol  levels. These may be checked every 5 years, or more frequently if you are over 26 years old. Skin check. Lung cancer screening. You may have this screening every year starting at age 78 if you have a 30-pack-year history of smoking and currently smoke or have quit within the past 15 years. Fecal occult blood test (FOBT) of the stool. You may have this test every year starting at age 18. Flexible sigmoidoscopy or colonoscopy. You may have a sigmoidoscopy every 5 years or a colonoscopy every 10 years starting at age 61. Hepatitis C blood test. Hepatitis B blood test. Sexually transmitted disease (STD) testing. Diabetes screening. This is done by checking your blood sugar (glucose) after you have not eaten for a while (fasting). You may have this done every 1-3 years. Bone density scan. This is done to screen for osteoporosis. You may have this done starting at age 59. Mammogram. This may be done every 1-2 years. Talk to your health care provider about how often you should have regular mammograms. Talk with your health care provider about your test results, treatment options, and if necessary, the need for more tests. Vaccines  Your health care provider may recommend certain vaccines, such as: Influenza vaccine. This is recommended every year. Tetanus, diphtheria, and acellular pertussis (Tdap, Td) vaccine. You may need a Td booster every 10 years. Zoster vaccine. You may need this after age 22. Pneumococcal 13-valent conjugate (PCV13) vaccine. One dose is recommended after age 37. Pneumococcal polysaccharide (PPSV23) vaccine. One dose is recommended after age 20. Talk to your health care provider about which screenings and vaccines you need and how often you need them. This information is not intended to replace advice given to you by your health care provider. Make sure you discuss any  questions you have with your health care provider. Document Released: 01/15/2015 Document Revised: 09/08/2015  Document Reviewed: 10/20/2014 Elsevier Interactive Patient Education  2017 [employer].  Fall Prevention in the Home Falls can cause injuries. They can happen to people of all ages. There are many things you can do to make your home safe and to help prevent falls. What can I do on the outside of my home? Regularly fix the edges of walkways and driveways and fix any cracks. Remove anything that might make you trip as you walk through a door, such as a raised step or threshold. Trim any bushes or trees on the path to your home. Use bright outdoor lighting. Clear any walking paths of anything that might make someone trip, such as rocks or tools. Regularly check to see if handrails are loose or broken. Make sure that both sides of any steps have handrails. Any raised decks and porches should have guardrails on the edges. Have any leaves, snow, or ice cleared regularly. Use sand or salt on walking paths during winter. Clean up any spills in your garage right away. This includes oil or grease spills. What can I do in the bathroom? Use night lights. Install grab bars by the toilet and in the tub and shower. Do not use towel bars as grab bars. Use non-skid mats or decals in the tub or shower. If you need to sit down in the shower, use a plastic, non-slip stool. Keep the floor dry. Clean up any water that spills on the floor as soon as it happens. Remove soap buildup in the tub or shower regularly. Attach bath mats securely with double-sided non-slip rug tape. Do not have throw rugs and other things on the floor that can make you trip. What can I do in the bedroom? Use night lights. Make sure that you have a light by your bed that is easy to reach. Do not use any sheets or blankets that are too big for your bed. They should not hang down onto the floor. Have a firm chair that has side arms. You can use this for support while you get dressed. Do not have throw rugs and other things on the floor  that can make you trip. What can I do in the kitchen? Clean up any spills right away. Avoid walking on wet floors. Keep items that you use a lot in easy-to-reach places. If you need to reach something above you, use a strong step stool that has a grab bar. Keep electrical cords out of the way. Do not use floor polish or wax that makes floors slippery. If you must use wax, use non-skid floor wax. Do not have throw rugs and other things on the floor that can make you trip. What can I do with my stairs? Do not leave any items on the stairs. Make sure that there are handrails on both sides of the stairs and use them. Fix handrails that are broken or loose. Make sure that handrails are as long as the stairways. Check any carpeting to make sure that it is firmly attached to the stairs. Fix any carpet that is loose or worn. Avoid having throw rugs at the top or bottom of the stairs. If you do have throw rugs, attach them to the floor with carpet tape. Make sure that you have a light switch at the top of the stairs and the bottom of the stairs. If you do not have them, ask someone to add them  for you. What else can I do to help prevent falls? Wear shoes that: Do not have high heels. Have rubber bottoms. Are comfortable and fit you well. Are closed at the toe. Do not wear sandals. If you use a stepladder: Make sure that it is fully opened. Do not climb a closed stepladder. Make sure that both sides of the stepladder are locked into place. Ask someone to hold it for you, if possible. Clearly mark and make sure that you can see: Any grab bars or handrails. First and last steps. Where the edge of each step is. Use tools that help you move around (mobility aids) if they are needed. These include: Canes. Walkers. Scooters. Crutches. Turn on the lights when you go into a dark area. Replace any light bulbs as soon as they burn out. Set up your furniture so you have a clear path. Avoid moving your  furniture around. If any of your floors are uneven, fix them. If there are any pets around you, be aware of where they are. Review your medicines with your doctor. Some medicines can make you feel dizzy. This can increase your chance of falling. Ask your doctor what other things that you can do to help prevent falls. This information is not intended to replace advice given to you by your health care provider. Make sure you discuss any questions you have with your health care provider. Document Released: 10/15/2008 Document Revised: 05/27/2015 Document Reviewed: 01/23/2014 Elsevier Interactive Patient Education  2017 [employer].

## 2022-06-21 IMAGING — DX DG THORACIC SPINE 2V
3 series · 3 of 3 positions shown · non-contrast
Comparison: None.

CLINICAL DATA: Prior surgery for scoliosis.

EXAM:
THORACIC SPINE 2 VIEWS

[t-spine ap (1 of 2)]
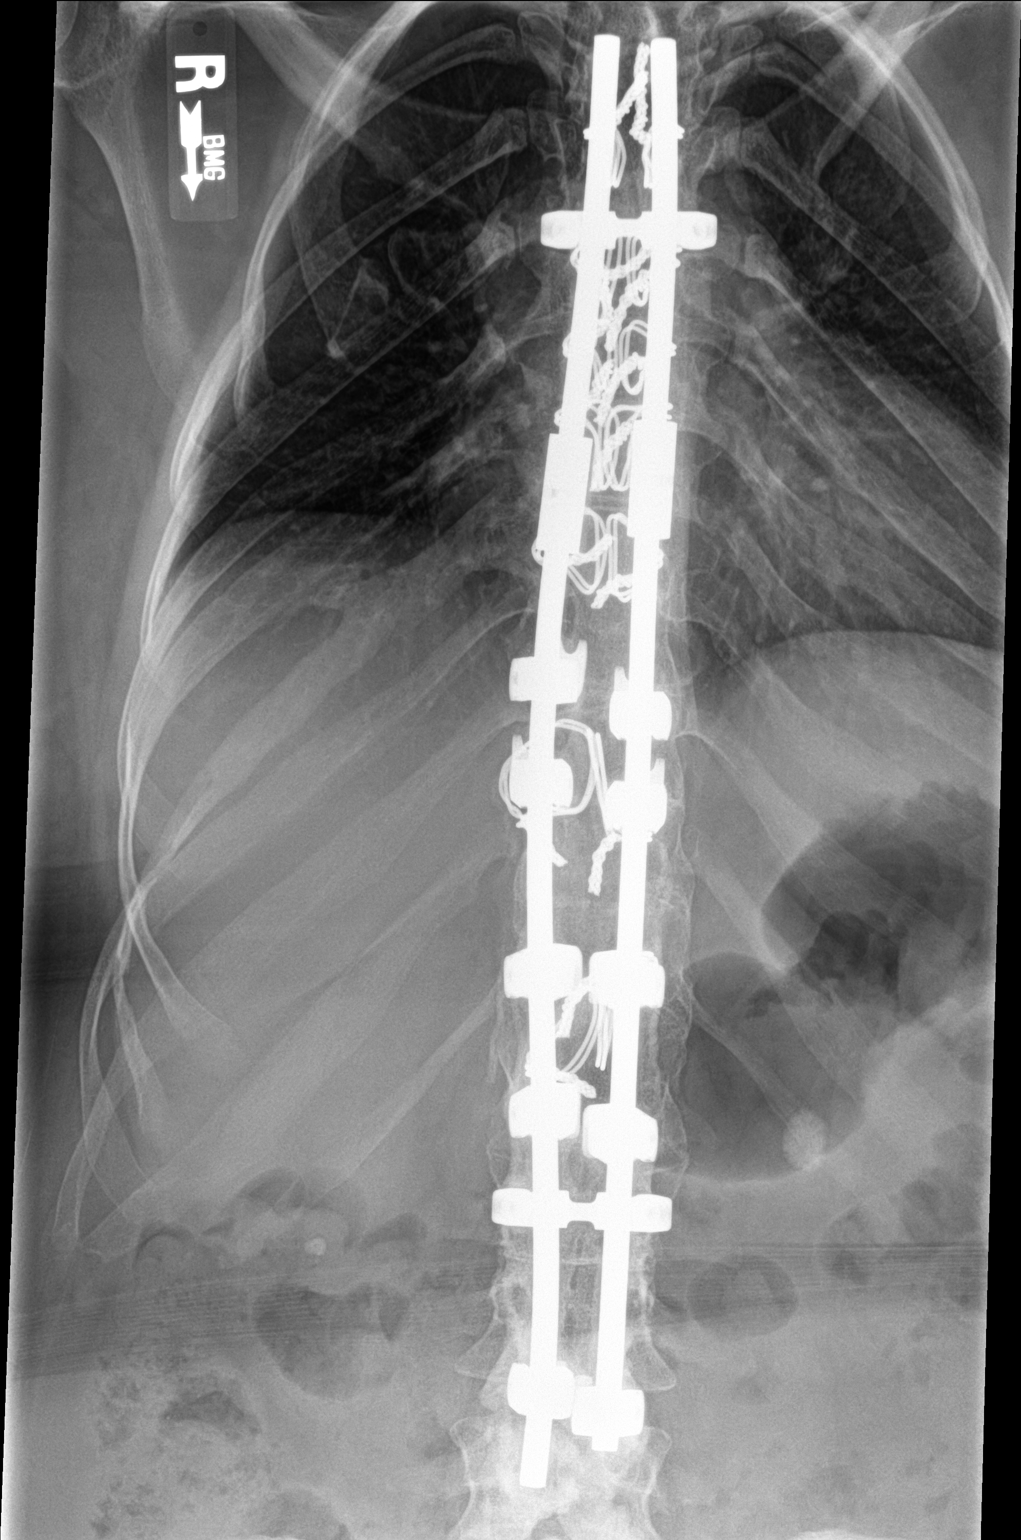

[t-spine lat]
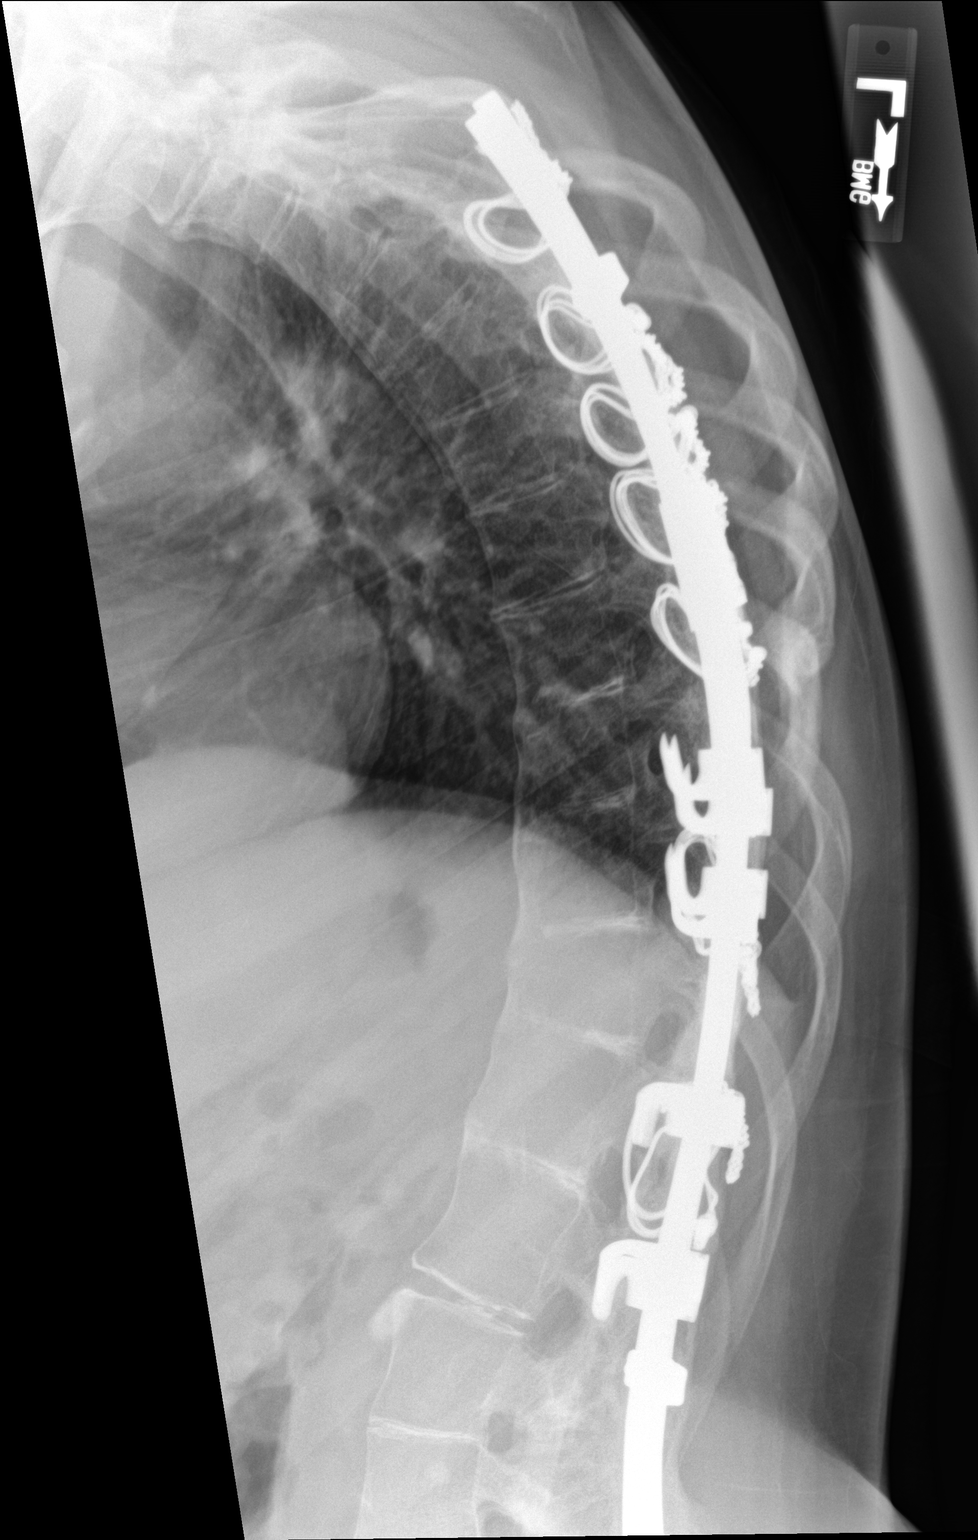

[t-spine ap (2 of 2)]
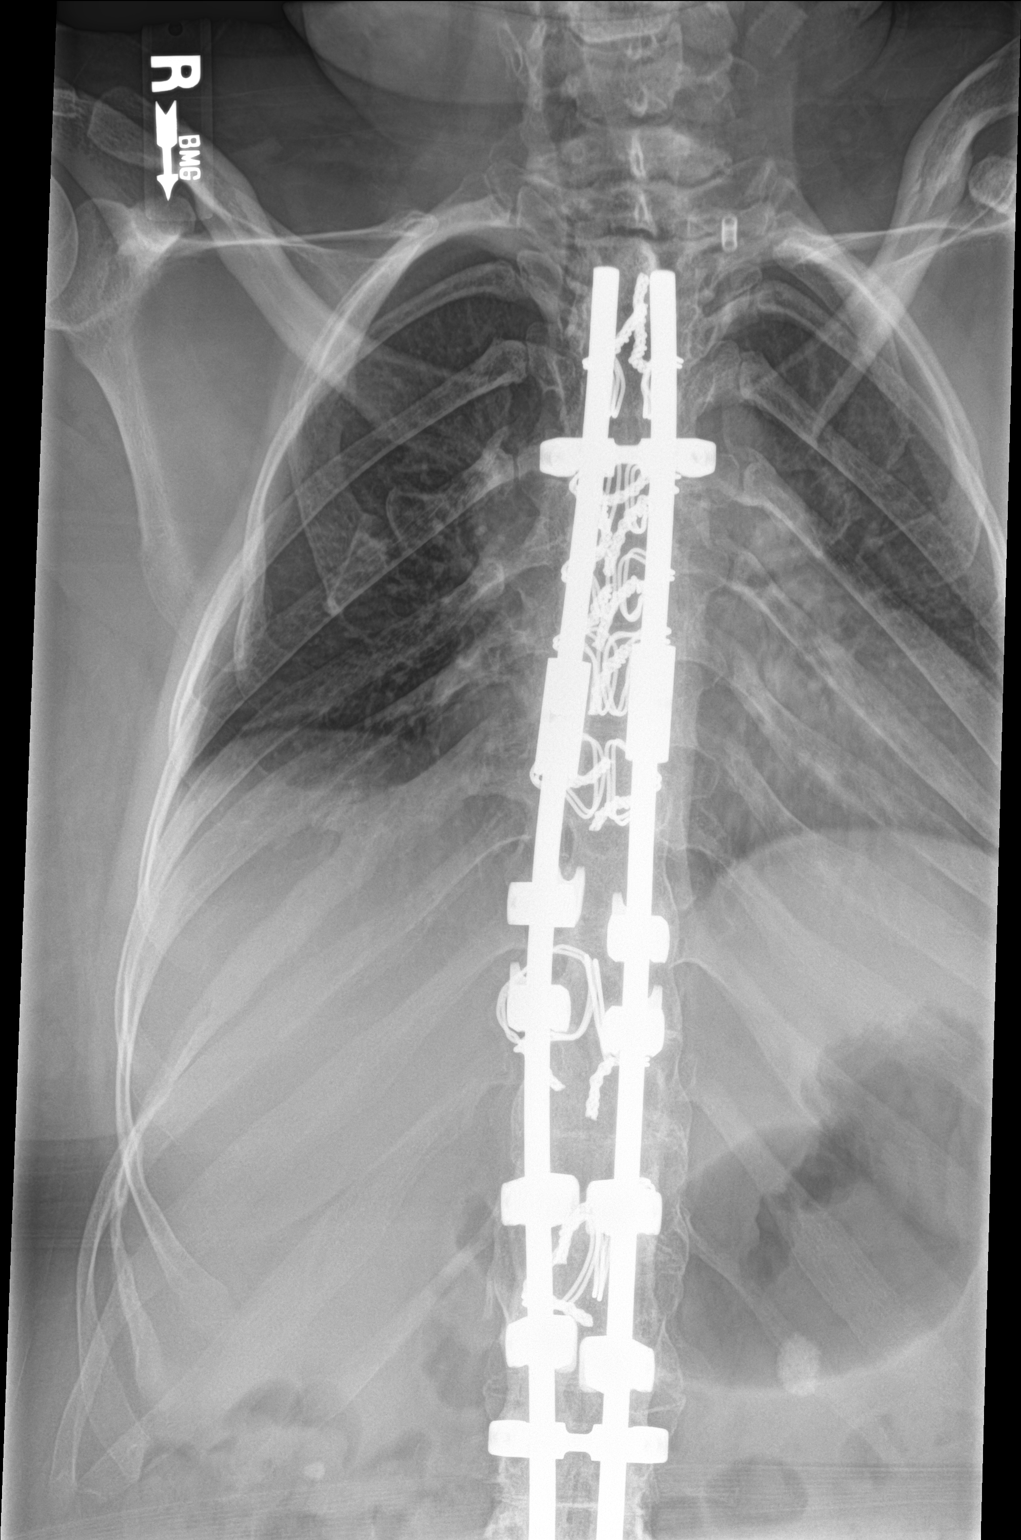

[3 of 3 positions shown; findings below may reference images not displayed]

FINDINGS: Twelve rib-bearing thoracic vertebral bodies. Extensive
thoracolumbar fusion hardware with solid posterior fusion. There is
also interbody fusion at multiple levels. No evidence of hardware
failure or loosening. No acute fracture or subluxation. Vertebral
body heights are preserved.

Alignment is normal. Intervertebral disc spaces are maintained.

1.7 cm left renal calculus.  5 mm right renal calculus.
IMPRESSION: 1. Extensive thoracolumbar fusion without hardware complication.
2. Bilateral nephrolithiasis.

## 2022-06-21 IMAGING — DX DG LUMBAR SPINE 2-3V
3 series · 3 of 3 positions shown · non-contrast
Comparison: None

CLINICAL DATA: Scoliosis post rod placement, LEFT lean at times

EXAM:
LUMBAR SPINE - 2-3 VIEW

[l-spine ap]
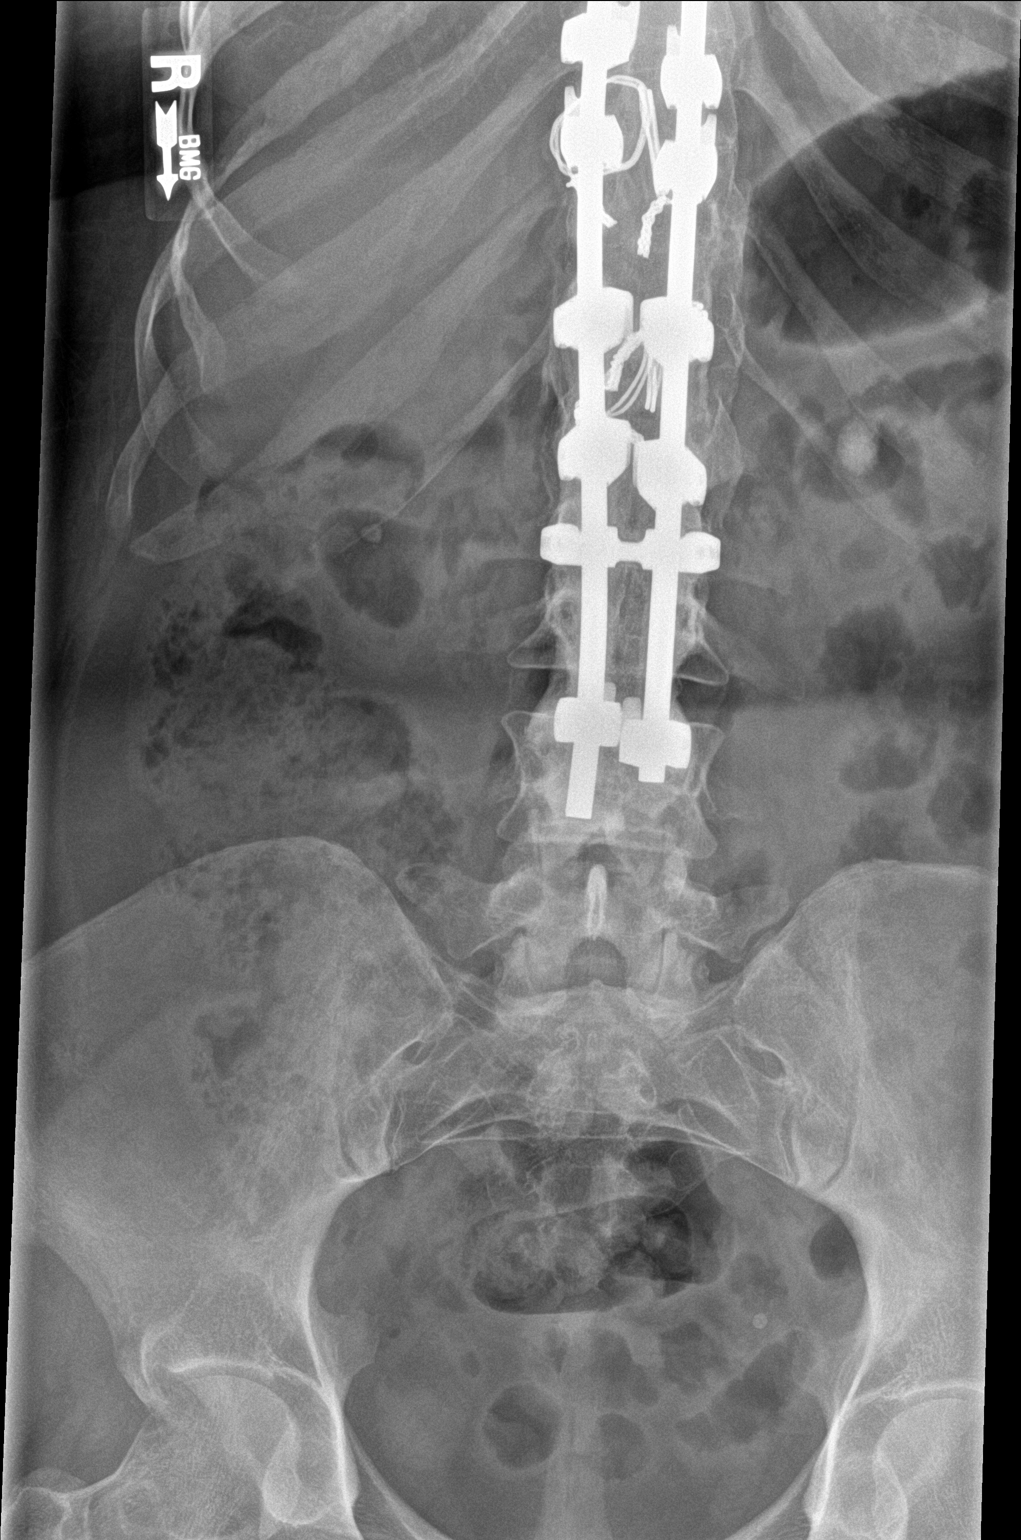

[l-spine lat]
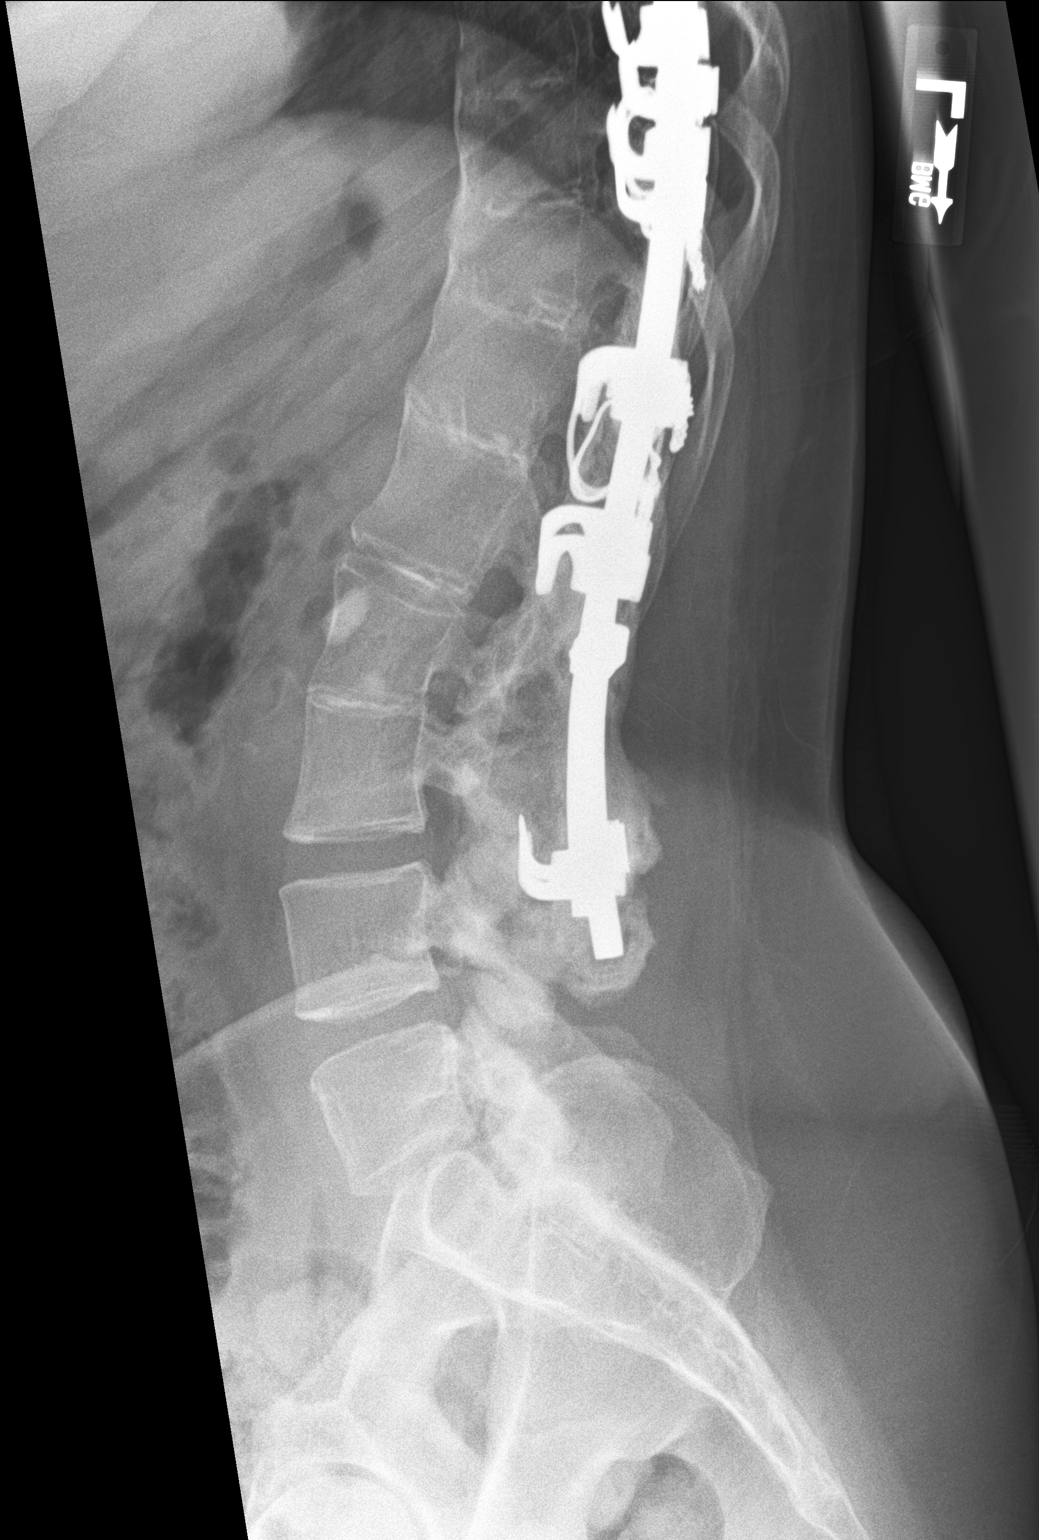

[l-spine l5-s1]
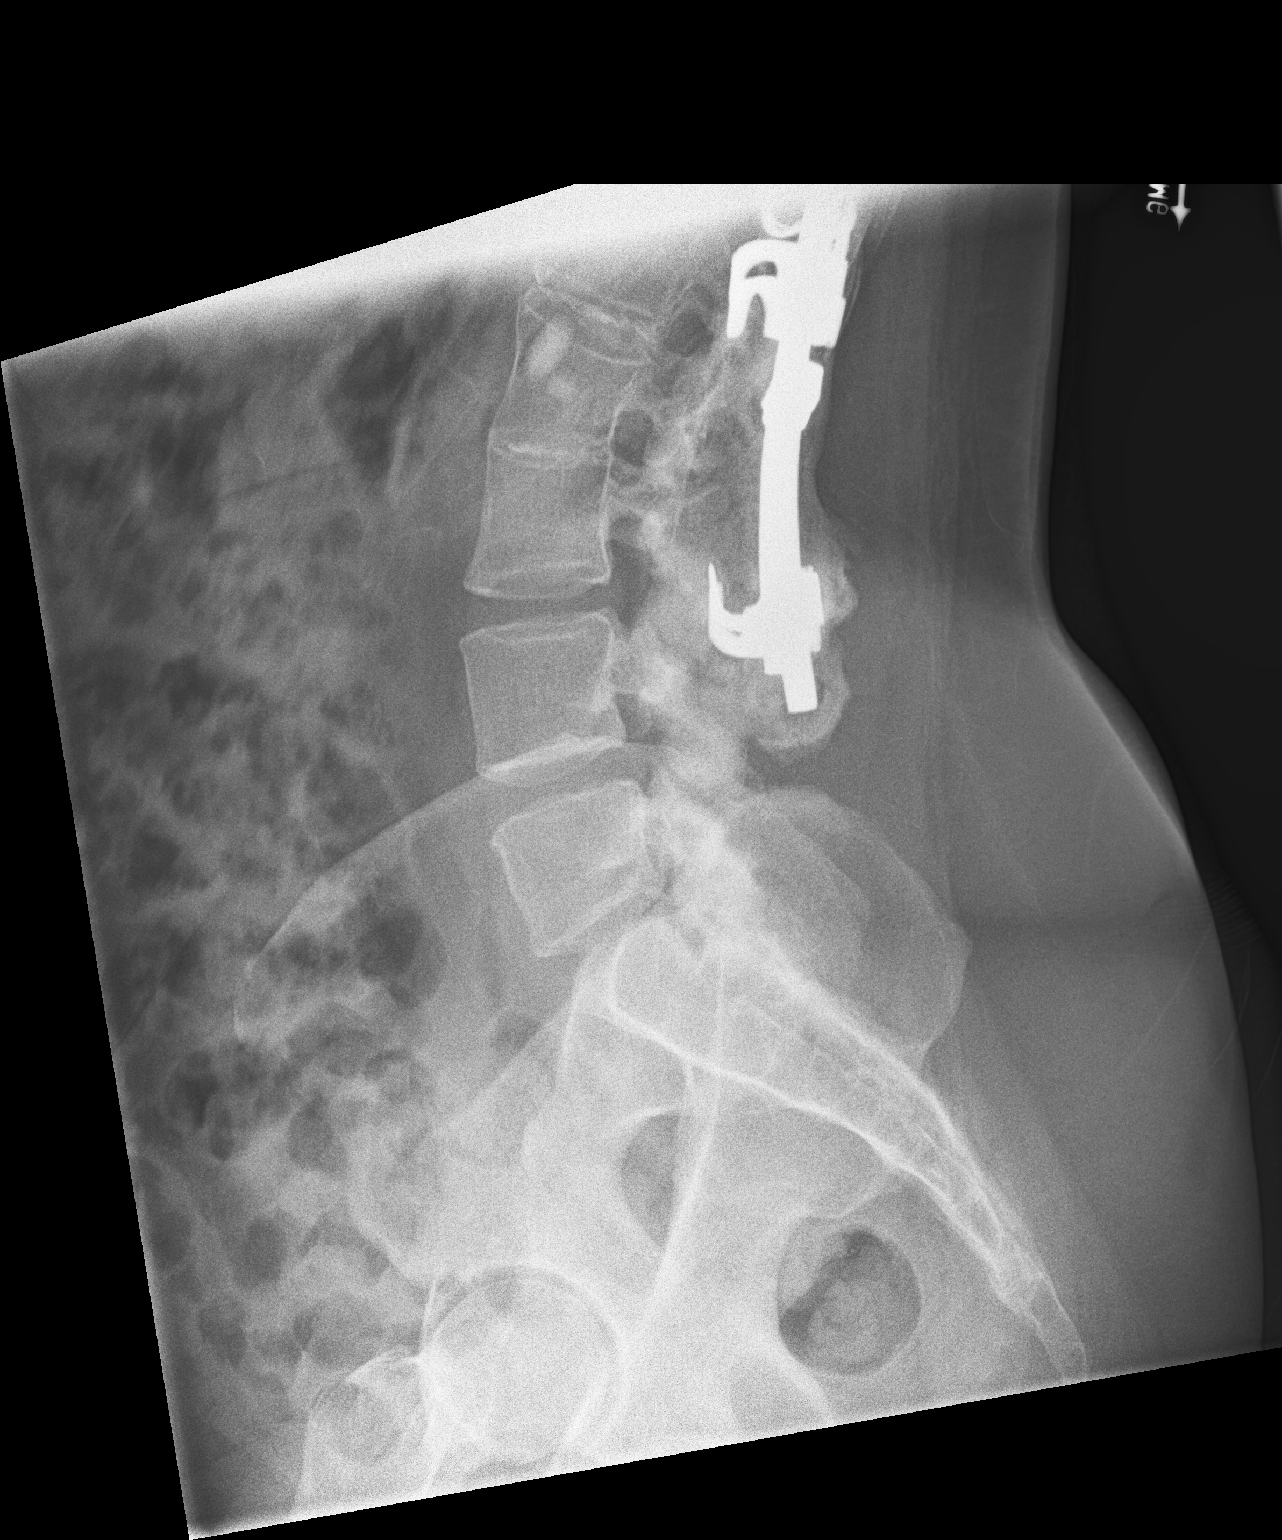

[3 of 3 positions shown; findings below may reference images not displayed]

FINDINGS: Thoracolumbar spinal fixation rods are present.

5 non-rib-bearing lumbar vertebra.

No fracture, subluxation or bone destruction.

Congenital block vertebra at L2-L3.

Fusion of lower thoracic spine.

Probable LEFT renal calculus 16 x 11 mm diameter.

Questionable 5 mm RIGHT renal calculus.
IMPRESSION: Prior thoracolumbar spinal fixation as above.

No acute abnormalities.

Probable BILATERAL renal calculi.

## 2022-08-03 ENCOUNTER — Ambulatory Visit: Payer: Medicare Other | Admitting: Orthopedic Surgery

## 2022-08-16 ENCOUNTER — Ambulatory Visit (INDEPENDENT_AMBULATORY_CARE_PROVIDER_SITE_OTHER): Payer: Medicare Other | Admitting: Orthopedic Surgery

## 2022-08-16 ENCOUNTER — Other Ambulatory Visit (INDEPENDENT_AMBULATORY_CARE_PROVIDER_SITE_OTHER): Payer: Medicare Other

## 2022-08-16 DIAGNOSIS — M4014 Other secondary kyphosis, thoracic region: Secondary | ICD-10-CM

## 2022-08-16 NOTE — Progress Notes (Signed)
Orthopedic Spine Surgery Office Note  Assessment: Patient is a 32 y.o. female with increased thoracic kyphosis. Kyphosis measures 62 degrees.  Is not having any pain or radicular symptoms   Plan: -No acute operative intervention -No spine specific precautions, activity as tolerated -Will continue to monitor -Patient should return to office in 1 year, x-rays at next visit: Scoliosis films   Patient expressed understanding of the plan and all questions were answered to the patient's satisfaction.   ___________________________________________________________________________   History:  Patient is a 32 y.o. female who presents today for thoracic spine.  Patient has been previously followed by Dr. Otelia Sergeant for increased thoracic kyphosis above her prior scoliosis surgery.  Patient comes in today for routine follow-up on that kyphosis.  She is not having any pain in her back.  She does not have any pain radiating into either lower extremity.  She has not had any limitations in terms of her activity due to her back.   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 Primary ovarian insufficiency  Allergies: NKDA  Past surgical history:  Scoliosis surgery  Social history: Denies use of nicotine product (smoking, vaping, patches, smokeless) Alcohol use: Denies Denies recreational drug use   Physical Exam:  General: no acute distress, appears stated age Neurologic: alert, answering questions appropriately, following commands Respiratory: unlabored breathing on room air, symmetric chest rise Psychiatric: appropriate  affect, normal cadence to speech   MSK (spine):  -Strength exam      Left  Right EHL    5/5  5/5 TA    5/5  5/5 GSC    5/5  5/5 Knee extension  5/5  5/5 Hip flexion   5/5  5/5  -Sensory exam    Sensation intact to light touch in L3-S1 nerve distributions of bilateral lower extremities  -Achilles DTR: 2/4 on the left, 2/4 on the right -Patellar tendon DTR: 2/4 on the left, 2/4 on the right  -Straight leg raise: negative bilaterally -Femoral nerve stretch test: negative bilaterally -Clonus: no beats bilaterally  -Left hip exam: No pain through range of motion, negative FADIR, negative FABER -Right hip exam: No pain through range of motion, negative FADIR, negative FABER  Imaging: XR of the thoracic spine from 08/16/2022 was independently reviewed and interpreted, showing long thoracolumbar fusion with hooks and wires. Hooks, wires, and rods do not have any lucency around them. No hooks have backed out of place. There appears to be fusion mass across the instrumented levels. There is increased kyphosis above her fusion that measures about 62 degrees.  No fracture or dislocation seen.   Patient name: Cheryl Dominguez Patient MRN: [id number] Date of visit: 08/16/22

## 2023-01-08 ENCOUNTER — Ambulatory Visit: Payer: Medicare Other | Admitting: Family Medicine

## 2023-01-10 ENCOUNTER — Ambulatory Visit (INDEPENDENT_AMBULATORY_CARE_PROVIDER_SITE_OTHER): Payer: Medicare Other | Admitting: Family Medicine

## 2023-01-10 ENCOUNTER — Encounter: Payer: Self-pay | Admitting: Family Medicine

## 2023-01-10 VITALS — BP 108/68 | HR 107 | Temp 98.7°F | Ht 63.0 in | Wt 180.0 lb

## 2023-01-10 DIAGNOSIS — R7989 Other specified abnormal findings of blood chemistry: Secondary | ICD-10-CM

## 2023-01-10 DIAGNOSIS — J309 Allergic rhinitis, unspecified: Secondary | ICD-10-CM | POA: Diagnosis not present

## 2023-01-10 DIAGNOSIS — H5210 Myopia, unspecified eye: Secondary | ICD-10-CM

## 2023-01-10 DIAGNOSIS — K5909 Other constipation: Secondary | ICD-10-CM

## 2023-01-10 LAB — COMPREHENSIVE METABOLIC PANEL
ALT: 32 U/L (ref 0–35)
AST: 22 U/L (ref 0–37)
Albumin: 3.9 g/dL (ref 3.5–5.2)
Alkaline Phosphatase: 66 U/L (ref 39–117)
BUN: 13 mg/dL (ref 6–23)
CO2: 31 meq/L (ref 19–32)
Calcium: 9.2 mg/dL (ref 8.4–10.5)
Chloride: 103 meq/L (ref 96–112)
Creatinine, Ser: 0.76 mg/dL (ref 0.40–1.20)
GFR: 102.72 mL/min (ref >60.00)
Glucose, Bld: 78 mg/dL (ref 70–99)
Potassium: 4.4 meq/L (ref 3.5–5.1)
Sodium: 140 meq/L (ref 135–145)
Total Bilirubin: 0.3 mg/dL (ref 0.2–1.2)
Total Protein: 7.1 g/dL (ref 6.0–8.3)

## 2023-01-10 NOTE — Patient Instructions (Signed)
 Thank you for coming today.  Continue Flonase  for allergies, option of 2 sprays per nostril if increased congestion.  1 spray per nostril is fine if that is working well.  Continue MiraLAX  if needed for constipation, fiber in the diet can also be helpful. I will place a referral for a new eye doctor.  If any concerns on labs today I will let you know.  Continue follow-up with other specialists.  Take care!

## 2023-01-10 NOTE — Progress Notes (Signed)
 Subjective:  Patient ID: Cheryl Dominguez Shoulder, female    DOB: Apr 06, 1990  Age: 33 y.o. MRN: [id number]  CC:  Chief Complaint  Patient presents with   Medical Management Dominguez Chronic Issues    Pt has been doing well, no concerns    HPI Cheryl Dominguez presents for   Chronic medication review.   Here with Cheryl Dominguez.  Needs optho - prior visit 1 year ago, prior provider left and specialty care only now.  Wears glasses. No other eye d/o   Followed by OB/GYN for primary ovarian insufficiency, hyperprolactinemia.  Followed by psychiatry (Dr. Idell Remington [facility]) with fetal valproate syndrome, developmental delay and ADD and audiology with use Dominguez hearing aids.  Is also seen orthopedics, Dr. Lucilla with history Dominguez kyphosis.  Saw Dr. Georgina in August.  Note reviewed, no acute operative intervention, no spine specific precautions with activity as tolerated.  Continued monitoring planned with repeat check in 1 year with scoliosis films. No changes.   Allergic rhinitis Treated with Flonase , working well with daily use. Option Dominguez 2 sprays/nostril discussed.   Chronic constipation MiraLAX  once per day, doing well without diarrhea, bowel movements typically daily. Still working well.  No abdominal pain, nausea, vomiting.  History Dominguez elevated LFTs History Dominguez obesity, possible hepatic steatosis.  Normalized levels in January Dominguez last year. Lab Results  Component Value Date   ALT 22 01/05/2022   AST 19 01/05/2022   ALKPHOS 67 01/05/2022   BILITOT 0.3 01/05/2022   HM: Tdap: recommended at pharmacy Flu vaccine at pharmacy in  October and covid booster then as well.  Followed by Gynecology.   History Patient Active Problem List   Diagnosis Date Noted   Elevated alanine aminotransferase (ALT) level 09/27/2017   Class 1 obesity due to excess calories without serious comorbidity with body mass index (BMI) Dominguez 32.0 to 32.9 in adult 12/01/2015   Pure hypercholesterolemia  12/01/2015   Seasonal allergic rhinitis due to pollen 12/01/2014   Slow transit constipation 12/01/2014   Primary ovarian insufficiency 12/01/2014   Hearing loss 12/01/2014   Fetal valproate syndrome 08/26/2014   Hyperprolactinemia (HCC) 08/24/2014   Tongue coating 12/31/2013   Seizures (HCC) 06/26/2012   ADD (attention deficit disorder)    Kyphosis 01/25/2011   Mental developmental delay 01/25/2011   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    Hearing loss    Primary ovarian insufficiency    Screening for mental disease/developmental disorder    Past Surgical History:  Procedure Laterality Date   BACK SURGERY     scoliosis; teenage   SPINE SURGERY N/A    [employer] 08/16/2019   No Known Allergies Prior to Admission medications   Medication Sig Start Date End Date Taking? Authorizing Provider  Ascorbic Acid (VITAMIN C) 100 MG tablet Take 100 mg by mouth daily.   Yes [provider]  calcium -vitamin D  (OSCAL WITH D) 500-200 MG-UNIT tablet Take 1 tablet by mouth 2 (two) times daily. 06/28/16  Yes Claudene Rayfield HERO, MD  CRANBERRY EXTRACT PO Take 1 capsule by mouth daily.   Yes [provider]  escitalopram (LEXAPRO) 20 MG tablet TK  1 T PO QD 02/10/17  Yes [provider]  fluticasone  (FLONASE ) 50 MCG/ACT nasal spray Place 1 spray into both nostrils daily. Place into the nose. 01/05/22  Yes Levora Reyes SAUNDERS, MD  guanFACINE (INTUNIV) 2 MG TB24 ER tablet Take 1 mg by mouth daily. Mother notes this is at 1 mg ER 05/11/21  Yes [provider]  lisdexamfetamine (VYVANSE) 40 MG capsule Take 40 mg by mouth every morning. Reported on 01/17/2015   Yes [provider]  loratadine  (CLARITIN ) 10 MG tablet Take 1 tablet (10 mg total) by mouth daily. 04/01/17  Yes Smith, Kristi M, MD  LORazepam (ATIVAN) 1 MG tablet Take 1 mg by mouth daily as needed for anxiety.   Yes [provider]  Multiple Vitamins-Minerals (MULTIVITAMIN PO) Take by mouth daily.   Yes [provider]  norethindrone-ethinyl estradiol -iron (MICROGESTIN FE,GILDESS FE,LOESTRIN FE) 1.5-30 MG-MCG tablet Take 1 tablet by mouth daily.   Yes [provider]  polyethylene glycol powder (GLYCOLAX /MIRALAX ) 17 GM/SCOOP powder Take by mouth.   Yes [provider]  Probiotic Product (PROBIOTIC DAILY PO) Take by mouth.   Yes [provider]  risperiDONE (RISPERDAL) 1 MG tablet Take 1 mg by mouth daily. 11/18/16  Yes [provider]   Social History   Socioeconomic History   Marital status: Single    Spouse name: Not on file   Number Dominguez children: 0   Years Dominguez education: 12   Highest education level: High school graduate  Occupational History   Occupation: disability    Comment: works at [employer]  Tobacco Use   Smoking status: Never   Smokeless tobacco: Never  Vaping Use   Vaping status: Never Used  Substance and Sexual Activity   Alcohol use: No   Drug use: No   Sexual activity: Never    Birth control/protection: None  Other Topics Concern   Not on file  Social History Narrative   Marital status: single; not dating in 2019.  Caregiver/Cheryl Dominguez; spends weekends.      Lives: Patient lives with [facility]/Cheryl Dominguez, Cheryl Dominguez (33yo) and Cheryl Dominguez/dog.     Education: Patient has 12th grade education. Attends Day Program [employer] Monday through Friday 10:00-4:00.      Employment:  Disability.      Education: attends [employer]; M-F 10-4      Tobacco: none      Alcohol: none      Exercise: walking on weekends; some exercise at day program.  Bowls once per week.     Right handed.    Caffeine- Couple times a week.- One   Social Drivers Dominguez Corporate Investment Banker Strain: Low Risk  (06/02/2021)   Overall Financial Resource Strain (CARDIA)    Difficulty Dominguez Paying Living Expenses: Not hard at all  Food Insecurity: No Food Insecurity (06/07/2022)   Hunger Vital Sign    Worried About Running Out Dominguez Food in the Last Year: Never true    Ran Out Dominguez Food in the Last Year: Never true  Transportation Needs: No Transportation Needs (06/02/2021)   PRAPARE - Administrator, Civil Service (Medical): No    Lack Dominguez Transportation (Non-Medical): No  Physical Activity: Sufficiently Active (06/07/2022)   Exercise Vital Sign    Days Dominguez Exercise per Week: 4 days    Minutes Dominguez Exercise per Session: 40 min  Stress: Stress Concern Present (06/07/2022)   Cheryl Dominguez  Occupational Health - Occupational Stress Questionnaire    Feeling Dominguez Stress : To some extent  Social Connections: Moderately Isolated (06/07/2022)   Social Connection and Isolation Panel [NHANES]    Frequency Dominguez Communication with Friends and Family: More than three times a week    Frequency Dominguez Social Gatherings with Friends and Family: More than three times a week    Attends Religious Services: More than 4 times per year    Active Member Dominguez [employer] or Organizations: No    Attends Banker Meetings: Never    Marital Status: Never married  Intimate Partner Violence: Not At Risk (06/02/2021)   Humiliation, Afraid, Rape, and Kick questionnaire    Fear Dominguez Current or Ex-Partner: No    Emotionally Abused: No    Physically Abused: No    Sexually Abused: No    Review Dominguez Systems   Objective:   Vitals:   01/10/23 0915  BP: 108/68  Pulse: (!) 107  Temp: 98.7 F (37.1 C)  TempSrc: Temporal  SpO2: 98%  Weight: 180 lb (81.6 kg)  Height: 5' 3 (1.6 m)     Physical Exam Vitals reviewed.  Constitutional:      Appearance: Normal appearance. She is well-developed.  HENT:     Head: Normocephalic and  atraumatic.  Eyes:     Conjunctiva/sclera: Conjunctivae normal.     Pupils: Pupils are equal, round, and reactive to light.  Neck:     Vascular: No carotid bruit.  Cardiovascular:     Rate and Rhythm: Normal rate and regular rhythm.     Heart sounds: Normal heart sounds.  Pulmonary:     Effort: Pulmonary effort is normal.     Breath sounds: Normal breath sounds.  Abdominal:     Palpations: Abdomen is soft. There is no pulsatile mass.     Tenderness: There is no abdominal tenderness.  Musculoskeletal:     Right lower leg: No edema.     Left lower leg: No edema.  Skin:    General: Skin is warm and dry.  Neurological:     Mental Status: She is alert and oriented to person, place, and time.  Psychiatric:        Mood and Affect: Mood normal.        Behavior: Behavior normal.        Assessment & Plan:  KENZA MUNAR is a 33 y.o. female . Allergic rhinitis, unspecified seasonality, unspecified trigger  -Stable Flonase , continue same.  Option Dominguez higher dosing if needed with RTC precautions.  Chronic constipation  -Fiber diet, MiraLAX  as needed, RTC precautions.  Elevated LFTs - Plan: Comp Met (CMET)  -Normal on last test, history Dominguez elevated LFTs previously.  Possible component Dominguez hepatic steatosis but will check LFTs again today, asymptomatic.  Myopia, unspecified laterality - Plan: Ambulatory referral to Ophthalmology  -New optometry/ophthalmology needed, referral placed.  Tdap discussed at her pharmacy.  Continue follow-up with other specialists  No orders Dominguez the defined types were placed in this encounter.  Patient Instructions  Thank you for coming today.  Continue Flonase  for allergies, option Dominguez 2 sprays per nostril if increased congestion.  1 spray per nostril is fine if that is working well.  Continue MiraLAX  if needed for constipation, fiber in the diet can also be helpful. I will place a referral for a new eye doctor.  If any concerns on labs today I will let  you know.  Continue follow-up with other specialists.  Take  care!    Signed,   Reyes Pines, MD [facility], [facility] 01/10/23 10:08 AM

## 2023-02-24 ENCOUNTER — Other Ambulatory Visit: Payer: Self-pay | Admitting: Family Medicine

## 2023-02-24 DIAGNOSIS — J309 Allergic rhinitis, unspecified: Secondary | ICD-10-CM

## 2023-06-12 ENCOUNTER — Encounter (HOSPITAL_BASED_OUTPATIENT_CLINIC_OR_DEPARTMENT_OTHER): Payer: Self-pay

## 2023-07-24 ENCOUNTER — Encounter

## 2023-08-16 ENCOUNTER — Other Ambulatory Visit (INDEPENDENT_AMBULATORY_CARE_PROVIDER_SITE_OTHER): Payer: Self-pay

## 2023-08-16 ENCOUNTER — Ambulatory Visit (INDEPENDENT_AMBULATORY_CARE_PROVIDER_SITE_OTHER): Payer: Medicare Other | Admitting: Orthopedic Surgery

## 2023-08-16 DIAGNOSIS — G8929 Other chronic pain: Secondary | ICD-10-CM | POA: Diagnosis not present

## 2023-08-16 DIAGNOSIS — M4014 Other secondary kyphosis, thoracic region: Secondary | ICD-10-CM | POA: Diagnosis not present

## 2023-08-16 DIAGNOSIS — M545 Low back pain, unspecified: Secondary | ICD-10-CM | POA: Diagnosis not present

## 2023-08-16 NOTE — Progress Notes (Signed)
 Orthopedic Surgery Office Note  Patient has been previously seen in the office for routine follow-up on her increased thoracic kyphosis.  She is not having any back pain.  No pain radiating into her lower extremities.  No symptoms of imbalance or unsteadiness.  No bowel or bladder continence.  She has not noticed any changes in her symptoms since she was last seen in the office about a year ago.  On exam, she has 5 out of 5 strength in all lower extremity myotomes.  She has 2 out of 4 DTRs at the patella and the Achilles.  Her sensations intact to light touch in L3-S1 distribution bilaterally.  No clonus seen in either lower extremity.  She ambulates without assistive devices.  X-rays were obtained today that showed similar long thoracolumbar fusion with hooks and wires.  Her kyphosis measures similar to her prior films about a year ago.  No fracture or dislocation was seen.   Since she is not having any symptoms and has not had any changes on radiographs, will continue with monitoring.  Will plan to see her back in 1 year.  X-rays at next visit: Scoliosis   Cheryl DELENA Ada, MD Orthopedic Surgeon

## 2023-09-06 ENCOUNTER — Telehealth: Payer: Self-pay | Admitting: Family Medicine

## 2023-09-06 NOTE — Telephone Encounter (Signed)
 Obtained documents and placed in providers folder at nurse station

## 2023-09-06 NOTE — Telephone Encounter (Signed)
 Type of form received: Physician's authorization to Administer Prescription Medication  Additional comments:   Received by: Fax  Form should be Faxed/mailed to: (address/ fax #) N/A  Is patient requesting call for pickup: Yes - Call Sonny [phone]  Form placed:  Labeled & placed in provider bin  Attach charge sheet.  Provider will determine charge.  Individual made aware of 3-5 business day turn around? Yes

## 2023-09-07 NOTE — Telephone Encounter (Signed)
 Faxed document back to requested number

## 2023-09-07 NOTE — Telephone Encounter (Signed)
 Paperwork completed and placed in fax bin at back nurse station

## 2023-09-12 NOTE — Telephone Encounter (Signed)
 Copied from CRM [id number]. Topic: General - Call Back - No Documentation >> Sep 12, 2023 11:26 AM Rea BROCKS wrote:  Reason for CRM: Mattie Slocumb called with patients guardian on the line in regards to the Physician's authorization to administer prescription medication.   I noted that fax was sent on the5th but she stated to not have received it.   Mattie provided her personal fax: [phone]   And also asked to please include the frequency and routes on the documents.   Documents have been faxed to number provided - CR 9/10 @ 12:00pm.

## 2023-10-09 ENCOUNTER — Ambulatory Visit (INDEPENDENT_AMBULATORY_CARE_PROVIDER_SITE_OTHER): Admitting: *Deleted

## 2023-10-09 VITALS — Ht 63.0 in | Wt 180.0 lb

## 2023-10-09 DIAGNOSIS — Z0000 Encounter for general adult medical examination without abnormal findings: Secondary | ICD-10-CM

## 2023-10-09 NOTE — Progress Notes (Signed)
 Subjective:   Cheryl Dominguez is a 33 y.o. female who presents for Medicare Annual (Subsequent) preventive examination.  Visit Complete: Virtual I connected with  Cheryl Dominguez  and Caregiver  Cheryl Dominguez on 10/09/23 by a audio enabled telemedicine application and verified that I am speaking with the correct person using two identifiers.  Patient Location: Home  Provider Location: Home Office  I discussed the limitations of evaluation and management by telemedicine. The patient expressed understanding and agreed to proceed.  Vital Signs: Because this visit was a virtual/telehealth visit, some criteria may be missing or patient reported. Any vitals not documented were not able to be obtained and vitals that have been documented are patient reported.    Cardiac Risk Factors include: obesity (BMI >30kg/m2)     Objective:    Today's Vitals   10/09/23 0830  Weight: 180 lb (81.6 kg)  Height: 5' 3 (1.6 m)   Body mass index is 31.89 kg/m.     10/09/2023    8:27 AM 06/07/2022    8:08 AM 06/02/2021    9:10 AM 12/04/2016    8:33 AM 03/21/2014    9:48 AM 02/19/2014    8:28 AM  Advanced Directives  Does Patient Have a Medical Advance Directive? Yes Yes Yes Yes  Yes  No   Type of Sales promotion account executive of [employer] Power of [place];Living will Healthcare Power of [place];Living will Healthcare Power of Attorney    Copy of Healthcare Power of Attorney in Chart? Yes - validated most recent copy scanned in chart (See row information) Yes - validated most recent copy scanned in chart (See row information) Yes - validated most recent copy scanned in chart (See row information) Yes  No - copy requested    Would patient like information on creating a medical advance directive?      No - patient declined information      Data saved with a previous flowsheet row definition    Current Medications (verified) Outpatient Encounter Medications as of  10/09/2023  Medication Sig   Ascorbic Acid (VITAMIN C) 100 MG tablet Take 100 mg by mouth daily.   calcium -vitamin D  (OSCAL WITH D) 500-200 MG-UNIT tablet Take 1 tablet by mouth 2 (two) times daily.   CRANBERRY EXTRACT PO Take 1 capsule by mouth daily.   escitalopram (LEXAPRO) 20 MG tablet TK 1 T PO QD   fluticasone  (FLONASE ) 50 MCG/ACT nasal spray SHAKE LIQUID AND USE 1 SPRAY IN EACH NOSTRIL DAILY   guanFACINE (INTUNIV) 2 MG TB24 ER tablet Take 1 mg by mouth daily. Mother notes this is at 1 mg ER   lisdexamfetamine (VYVANSE) 40 MG capsule Take 40 mg by mouth every morning. Reported on 01/17/2015   loratadine  (CLARITIN ) 10 MG tablet Take 1 tablet (10 mg total) by mouth daily.   LORazepam (ATIVAN) 1 MG tablet Take 1 mg by mouth daily as needed for anxiety.   Multiple Vitamins-Minerals (MULTIVITAMIN PO) Take by mouth daily.   norethindrone-ethinyl estradiol -iron (MICROGESTIN FE,GILDESS FE,LOESTRIN FE) 1.5-30 MG-MCG tablet Take 1 tablet by mouth daily.   polyethylene glycol powder (GLYCOLAX /MIRALAX ) 17 GM/SCOOP powder Take by mouth.   Probiotic Product (PROBIOTIC DAILY PO) Take by mouth.   risperiDONE (RISPERDAL) 1 MG tablet Take 1 mg by mouth daily.   No facility-administered encounter medications on file as of 10/09/2023.    Allergies (verified) Patient has no known allergies.   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    Hearing loss    Primary ovarian insufficiency    Screening for mental disease/developmental disorder    Past Surgical History:  Procedure Laterality Date   BACK SURGERY     scoliosis; teenage   SPINE SURGERY N/A    Phreesia 08/16/2019   Family History  Problem  Relation Age of Onset   Seizures Mother    Hypothyroidism Mother    Diabetes Father    Heart disease Father 1       AMI as cause of death   COPD Father    Seizures Sister    Mental retardation Sister    Social History   Socioeconomic History   Marital status: Single    Spouse name: Not on file   Number of children: 0   Years of education: 12   Highest education level: High school graduate  Occupational History   Occupation: disability    Comment: works at [employer]  Tobacco Use   Smoking status: Never   Smokeless tobacco: Never  Vaping Use   Vaping status: Never Used  Substance and Sexual Activity   Alcohol use: No   Drug use: No   Sexual activity: Never    Birth control/protection: None  Other Topics Concern   Not on file  Social History Narrative   Marital status: single; not dating in 2019.  Caregiver/Cheryl Dominguez Sanders; spends weekends.      Lives: Patient lives with [facility]/[name], Cheryl Dominguez (33yo) and [name]/dog.     Education: Patient has 12th grade education. Attends Day Program [employer] Monday through Friday 10:00-4:00.      Employment:  Disability.      Education: attends [employer]; M-F 10-4      Tobacco: none      Alcohol: none      Exercise: walking on weekends; some exercise at day program.  Bowls once per week.     Right handed.   Caffeine- Couple times a week.- One   Social Drivers of Corporate investment banker Strain: Low Risk  (10/09/2023)   Overall Financial Resource Strain (CARDIA)    Difficulty of Paying Living Expenses: Not hard at all  Food Insecurity: No Food Insecurity (10/09/2023)   Hunger Vital Sign    Worried About Running Out of Food in the Last Year: Never true    Ran Out of Food in the Last Year: Never true  Transportation Needs: Unknown (10/09/2023)   PRAPARE - Administrator, Civil Service (Medical): No    Lack of Transportation (Non-Medical): Not on file   Physical Activity: Insufficiently Active (10/09/2023)   Exercise Vital Sign    Days of Exercise per Week: 4 days    Minutes of Exercise per Session: 30 min  Stress: No Stress Concern Present (10/09/2023)   [name] of Occupational Health - Occupational Stress Questionnaire    Feeling of Stress: Not at all  Social Connections: Moderately Isolated (10/09/2023)   Social Connection and Isolation Panel    Frequency of Communication with Friends and Family: More than three times a week    Frequency of Social Gatherings with Friends and Family:  More than three times a week    Attends Religious Services: More than 4 times per year    Active Member of Clubs or Organizations: No    Attends Banker Meetings: Never    Marital Status: Never married    Tobacco Counseling Counseling given: Not Answered   Clinical Intake:  Pre-visit preparation completed: Yes  Pain : No/denies pain     Diabetes: No  How often do you need to have someone help you when you read instructions, pamphlets, or other written materials from your doctor or pharmacy?: 1 - Never  Interpreter Needed?: No  Information entered by :: Mliss Graff LPN   Activities of Daily Living    10/09/2023    8:17 AM  In your present state of health, do you have any difficulty performing the following activities:  Hearing? 1  Vision? 0  Difficulty concentrating or making decisions? 0  Walking or climbing stairs? 0  Dressing or bathing? 0  Doing errands, shopping? 1  Preparing Food and eating ? N  Using the Toilet? N  In the past six months, have you accidently leaked urine? N  Do you have problems with loss of bowel control? N  Managing your Medications? Y  Managing your Finances? Y  Housekeeping or managing your Housekeeping? N    Patient Care Team: Levora Reyes SAUNDERS, MD as PCP - General (Family Medicine) Rogelio Planas, MD as Consulting Physician (Obstetrics and Gynecology) Rogelio Planas, MD as Consulting Physician (Obstetrics and Gynecology) Rogelio Planas, MD as Consulting Physician (Obstetrics and Gynecology)  Indicate any recent Medical Services you may have received from other than [facility] providers in the past year (date may be approximate).     Assessment:   This is a routine wellness examination for Cheryl Dominguez.  Hearing/Vision screen Hearing Screening - Comments:: Bilateral hearing aids Vision Screening - Comments:: Up to date [facility]   Goals Addressed             This Visit's Progress    DIET - EAT MORE FRUITS AND VEGETABLES   On track    Patient states that she wants to eat more fruit and vegetables on a daily basis.      Patient Stated       Maintain weight       Depression Screen    10/09/2023    8:20 AM 01/10/2023    9:13 AM 06/07/2022    8:12 AM 01/05/2022    9:07 AM 08/11/2021    3:38 PM 06/02/2021    9:08 AM 01/06/2021    4:08 PM  PHQ 2/9 Scores  PHQ - 2 Score 0 2 0 0 0 0 0  PHQ- 9 Score 0 3 2 0       Fall Risk    10/09/2023    8:28 AM 01/10/2023    9:12 AM 06/07/2022    8:05 AM 01/05/2022    9:07 AM 08/11/2021    3:38 PM  Fall Risk   Falls in the past year? 0 0 0 0 1  Number falls in past yr: 0 0 0 0 0  Injury with Fall? 0 0 0 0 1  Risk for fall due to :  No Fall Risks  No Fall Risks No Fall Risks  Follow up Falls evaluation completed;Education provided;Falls prevention discussed Falls evaluation completed Falls evaluation completed;Education provided;Falls prevention discussed Falls evaluation completed  Falls evaluation completed      Data saved with a previous  flowsheet row definition    MEDICARE RISK AT HOME: Medicare Risk at Home Any stairs in or around the home?: No If so, are there any without handrails?: No Home free of loose throw rugs in walkways, pet beds, electrical cords, etc?: Yes Adequate lighting in your home to reduce risk of falls?: Yes Life alert?: No Use of a cane, walker or w/c?: No Grab  bars in the bathroom?: No Shower chair or bench in shower?: No Elevated toilet seat or a handicapped toilet?: Yes  TIMED UP AND GO:  Was the test performed?  No    Cognitive Function:        10/09/2023    8:29 AM 06/07/2022    8:14 AM 11/20/2019   10:24 AM 12/04/2016    8:38 AM  6CIT Screen  What Year? 0 points 0 points 4 points 0 points  What month? 0 points 0 points 0 points 0 points  What time? 0 points 0 points 3 points 0 points  Count back from 20 0 points 0 points 0 points 0 points  Months in reverse 4 points 0 points 0 points 0 points  Repeat phrase 0 points 0 points 0 points 4 points  Total Score 4 points 0 points 7 points 4 points    Immunizations Immunization History  Administered Date(s) Administered   HPV Quadrivalent 03/27/2011   Influenza Split 10/23/2011   Influenza,inj,Quad PF,6+ Mos 10/29/2010, 11/10/2013, 11/06/2014, 09/01/2015, 10/07/2016, 09/22/2017, 09/19/2018, 10/07/2019, 10/14/2020, 10/03/2021   Influenza-Unspecified 09/16/2012   PFIZER(Purple Top)SARS-COV-2 Vaccination 02/07/2019, 02/28/2019, 11/06/2019   Pfizer Covid-19 Vaccine Bivalent Booster 61yrs & up 09/24/2020   Tdap 01/25/2011    TDAP status: Due, Education has been provided regarding the importance of this vaccine. Advised may receive this vaccine at local pharmacy or Health Dept. Aware to provide a copy of the vaccination record if obtained from local pharmacy or Health Dept. Verbalized acceptance and understanding.  Flu Vaccine status: Due, Education has been provided regarding the importance of this vaccine. Advised may receive this vaccine at local pharmacy or Health Dept. Aware to provide a copy of the vaccination record if obtained from local pharmacy or Health Dept. Verbalized acceptance and understanding.    Covid-19 vaccine status: Completed vaccines  Qualifies for Shingles Vaccine   No  Screening Tests Health Maintenance  Topic Date Due   Hepatitis B Vaccines 19-59 Average  Risk (1 of 3 - 19+ 3-dose series) Never done   HPV VACCINES (2 - 3-dose series) 04/24/2011   Cervical Cancer Screening (HPV/Pap Cotest)  09/03/2019   DTaP/Tdap/Td (2 - Td or Tdap) 01/24/2021   Influenza Vaccine  08/03/2023   COVID-19 Vaccine (5 - 2025-26 season) 09/03/2023   Medicare Annual Wellness (AWV)  10/08/2024   Hepatitis C Screening  Completed   HIV Screening  Completed   Pneumococcal Vaccine  Aged Out   Meningococcal B Vaccine  Aged Out    Health Maintenance       Lung Cancer Screening: (Low Dose CT Chest recommended if Age 65-80 years, 20 pack-year currently smoking OR have quit w/in 15years.) does not qualify.   Lung Cancer Screening Referral:   Additional Screening:  Hepatitis C Screening   2024 completed  Vision Screening: Recommended annual ophthalmology exams for early detection of glaucoma and other disorders of the eye. Is the patient up to date with their annual eye exam?  Yes  Who is the provider or what is the name of the office in which the patient attends annual eye  exams? [facility] If pt is not established with a provider, would they like to be referred to a provider to establish care? No .   Dental Screening: Recommended annual dental exams for proper oral hygiene    Community Resource Referral / Chronic Care Management: CRR required this visit?  No   CCM required this visit?  No     Plan:     I have personally reviewed and noted the following in the patient's chart:   Medical and social history Use of alcohol, tobacco or illicit drugs  Current medications and supplements including opioid prescriptions. Patient is not currently taking opioid prescriptions. Functional ability and status Nutritional status Physical activity Advanced directives List of other physicians Hospitalizations, surgeries, and ER visits in previous 12 months Vitals Screenings to include cognitive, depression, and falls Referrals and  appointments  In addition, I have reviewed and discussed with patient certain preventive protocols, quality metrics, and best practice recommendations. A written personalized care plan for preventive services as well as general preventive health recommendations were provided to patient.     Mliss Graff, LPN   [id number]   After Visit Summary: (MyChart) Due to this being a telephonic visit, the after visit summary with patients personalized plan was offered to patient via MyChart   Nurse Notes:

## 2023-10-09 NOTE — Patient Instructions (Addendum)
 Cheryl Dominguez , Thank you for taking time to come for your Medicare Wellness Visit. I appreciate your ongoing commitment to your health goals. Please review the following plan we discussed and let me know if I can assist you in the future.   Screening recommendations/referrals Recommended yearly ophthalmology/optometry visit for glaucoma screening and checkup Recommended yearly dental visit for hygiene and checkup  Vaccinations: Influenza vaccine: Education provided Tdap vaccine: Education provided     Advanced directives: yes    Preventive Care , Female Preventive care refers to lifestyle choices and visits with your health care provider that can promote health and wellness. What does preventive care include? A yearly physical exam. This is also called an annual well check. Dental exams once or twice a year. Routine eye exams. Ask your health care provider how often you should have your eyes checked. Personal lifestyle choices, including: Daily care of your teeth and gums. Regular physical activity. Eating a healthy diet. Avoiding tobacco and drug use. Limiting alcohol use. Practicing safe sex. Taking low-dose aspirin every day. Taking vitamin and mineral supplements as recommended by your health care provider. What happens during an annual well check? The services and screenings done by your health care provider during your annual well check will depend on your age, overall health, lifestyle risk factors, and family history of disease. Counseling  Your health care provider may ask you questions about your: Alcohol use. Tobacco use. Drug use. Emotional well-being. Home and relationship well-being. Sexual activity. Eating habits. History of falls. Memory and ability to understand (cognition). Work and work Astronomer. Reproductive health. Screening  You may have the following tests or measurements: Height, weight, and BMI. Blood pressure. Lipid and cholesterol levels.  These may be checked every 5 years, or more frequently if you are over 64 years old. Skin check. Lung cancer screening. You may have this screening every year starting at age 30 if you have a 30-pack-year history of smoking and currently smoke or have quit within the past 15 years. Fecal occult blood test (FOBT) of the stool. You may have this test every year starting at age 55. Flexible sigmoidoscopy or colonoscopy. You may have a sigmoidoscopy every 5 years or a colonoscopy every 10 years starting at age 45. Hepatitis C blood test. Hepatitis B blood test. Sexually transmitted disease (STD) testing. Diabetes screening. This is done by checking your blood sugar (glucose) after you have not eaten for a while (fasting). You may have this done every 1-3 years. Bone density scan. This is done to screen for osteoporosis. You may have this done starting at age 23. Mammogram. This may be done every 1-2 years. Talk to your health care provider about how often you should have regular mammograms. Talk with your health care provider about your test results, treatment options, and if necessary, the need for more tests. Vaccines  Your health care provider may recommend certain vaccines, such as: Influenza vaccine. This is recommended every year. Tetanus, diphtheria, and acellular pertussis (Tdap, Td) vaccine. You may need a Td booster every 10 years. Zoster vaccine. You may need this after age 72. Pneumococcal 13-valent conjugate (PCV13) vaccine. One dose is recommended after age 62. Pneumococcal polysaccharide (PPSV23) vaccine. One dose is recommended after age 70. Talk to your health care provider about which screenings and vaccines you need and how often you need them. This information is not intended to replace advice given to you by your health care provider. Make sure you discuss any questions you have  with your health care provider. Document Released: 01/15/2015 Document Revised: 09/08/2015 Document  Reviewed: 10/20/2014 Elsevier Interactive Patient Education  2017 [employer].  Fall Prevention in the Home Falls can cause injuries. They can happen to people of all ages. There are many things you can do to make your home safe and to help prevent falls. What can I do on the outside of my home? Regularly fix the edges of walkways and driveways and fix any cracks. Remove anything that might make you trip as you walk through a door, such as a raised step or threshold. Trim any bushes or trees on the path to your home. Use bright outdoor lighting. Clear any walking paths of anything that might make someone trip, such as rocks or tools. Regularly check to see if handrails are loose or broken. Make sure that both sides of any steps have handrails. Any raised decks and porches should have guardrails on the edges. Have any leaves, snow, or ice cleared regularly. Use sand or salt on walking paths during winter. Clean up any spills in your garage right away. This includes oil or grease spills. What can I do in the bathroom? Use night lights. Install grab bars by the toilet and in the tub and shower. Do not use towel bars as grab bars. Use non-skid mats or decals in the tub or shower. If you need to sit down in the shower, use a plastic, non-slip stool. Keep the floor dry. Clean up any water that spills on the floor as soon as it happens. Remove soap buildup in the tub or shower regularly. Attach bath mats securely with double-sided non-slip rug tape. Do not have throw rugs and other things on the floor that can make you trip. What can I do in the bedroom? Use night lights. Make sure that you have a light by your bed that is easy to reach. Do not use any sheets or blankets that are too big for your bed. They should not hang down onto the floor. Have a firm chair that has side arms. You can use this for support while you get dressed. Do not have throw rugs and other things on the floor that can  make you trip. What can I do in the kitchen? Clean up any spills right away. Avoid walking on wet floors. Keep items that you use a lot in easy-to-reach places. If you need to reach something above you, use a strong step stool that has a grab bar. Keep electrical cords out of the way. Do not use floor polish or wax that makes floors slippery. If you must use wax, use non-skid floor wax. Do not have throw rugs and other things on the floor that can make you trip. What can I do with my stairs? Do not leave any items on the stairs. Make sure that there are handrails on both sides of the stairs and use them. Fix handrails that are broken or loose. Make sure that handrails are as long as the stairways. Check any carpeting to make sure that it is firmly attached to the stairs. Fix any carpet that is loose or worn. Avoid having throw rugs at the top or bottom of the stairs. If you do have throw rugs, attach them to the floor with carpet tape. Make sure that you have a light switch at the top of the stairs and the bottom of the stairs. If you do not have them, ask someone to add them for you. What  else can I do to help prevent falls? Wear shoes that: Do not have high heels. Have rubber bottoms. Are comfortable and fit you well. Are closed at the toe. Do not wear sandals. If you use a stepladder: Make sure that it is fully opened. Do not climb a closed stepladder. Make sure that both sides of the stepladder are locked into place. Ask someone to hold it for you, if possible. Clearly mark and make sure that you can see: Any grab bars or handrails. First and last steps. Where the edge of each step is. Use tools that help you move around (mobility aids) if they are needed. These include: Canes. Walkers. Scooters. Crutches. Turn on the lights when you go into a dark area. Replace any light bulbs as soon as they burn out. Set up your furniture so you have a clear path. Avoid moving your furniture  around. If any of your floors are uneven, fix them. If there are any pets around you, be aware of where they are. Review your medicines with your doctor. Some medicines can make you feel dizzy. This can increase your chance of falling. Ask your doctor what other things that you can do to help prevent falls. This information is not intended to replace advice given to you by your health care provider. Make sure you discuss any questions you have with your health care provider. Document Released: 10/15/2008 Document Revised: 05/27/2015 Document Reviewed: 01/23/2014 Elsevier Interactive Patient Education  2017 [employer].

## 2023-10-15 ENCOUNTER — Ambulatory Visit (INDEPENDENT_AMBULATORY_CARE_PROVIDER_SITE_OTHER)

## 2023-10-15 DIAGNOSIS — Z23 Encounter for immunization: Secondary | ICD-10-CM | POA: Diagnosis not present

## 2023-10-15 NOTE — Progress Notes (Signed)
 Cheryl Dominguez is a 33 y.o. female presents in office today for a nurse visit for Flu Vaccine .   Patient Injection was given in the  Right deltoid. Patient tolerated injection well.   Patient's next injection due n/a, appt made? not applicable  [employer]

## 2023-11-05 ENCOUNTER — Encounter: Payer: Self-pay | Admitting: Radiology

## 2024-01-16 ENCOUNTER — Ambulatory Visit: Payer: Medicare Other | Admitting: Family Medicine

## 2024-01-16 ENCOUNTER — Encounter: Payer: Self-pay | Admitting: Family Medicine

## 2024-01-16 VITALS — BP 118/74 | HR 94 | Temp 98.8°F | Resp 16 | Ht 63.0 in | Wt 176.0 lb

## 2024-01-16 DIAGNOSIS — K5909 Other constipation: Secondary | ICD-10-CM

## 2024-01-16 DIAGNOSIS — R569 Unspecified convulsions: Secondary | ICD-10-CM

## 2024-01-16 DIAGNOSIS — S60419A Abrasion of unspecified finger, initial encounter: Secondary | ICD-10-CM | POA: Diagnosis not present

## 2024-01-16 DIAGNOSIS — J309 Allergic rhinitis, unspecified: Secondary | ICD-10-CM

## 2024-01-16 DIAGNOSIS — R0981 Nasal congestion: Secondary | ICD-10-CM

## 2024-01-16 MED ORDER — FLUTICASONE PROPIONATE 50 MCG/ACT NA SUSP: 1.0000 | Freq: Every day | NASAL | 11 refills | Status: AC

## 2024-01-16 MED ORDER — AZELASTINE HCL 0.1 % NA SOLN: 1.0000 | Freq: Two times a day (BID) | NASAL | 3 refills | Status: AC | PRN

## 2024-01-16 NOTE — Progress Notes (Signed)
 "  Subjective:  Patient ID: Cheryl Dominguez, female    DOB: 08-30-1990  Age: 34 y.o. MRN: [id number]  CC:  Chief Complaint  Patient presents with   Nasal Congestion    HPI Cheryl Dominguez presents for  Follow-up of chronic conditions, concern above.  She is here with Sonny today, who provides additional history.no additional health concerns.   Allergic rhinitis, nasal congestion She takes Claritin  once per day, Flonase  nasal spray, 1 spray in each nostril each day but still has some nasal congestion at times.   Chronic constipation Has been managed well previously with MiraLAX  daily. Bowel movements daily. Normal. No diarrhea.   History of elevated LFTs With history of obesity, suspected hepatic steatosis.  Levels improved on repeat testing.   LFTS normal in 10/10/23. Normal A1c at that time.  Normal tsh in August.  Lab Results  Component Value Date   ALT 32 01/10/2023   AST 22 01/10/2023   ALKPHOS 66 01/10/2023   BILITOT 0.3 01/10/2023    Finger/fingernail concern: Sonny has noted her picking at her fingers at times.  Has picked fingers to point of swelling and redness prior - better with epsom salts. Chronic issue.  She is followed by psychiatry with ADD, fetal valproates syndrome, developmental delay.  Treated with Intuniv, vyvanse, Lexapro, Risperdal.  No recent changes. Chrnoic anxiety habit.    Health maintenance: Had vaccines at pharmacy. Will check td status at pharmacy.  Had HPV vaccine, not sexually active. Pap in 2016, but negative HPV high risk test noted 08/01/21 - [facility]. Followed by gynecology.   Seizure disorder Chart reviewed.  It appears there was some initial concern for seizures and had been treated with Lamictal  in the past,Concern for partial seizure.  EEG in 2014 without evidence of epilepsy for discharge.  Normal awake EEG.  On note 05/13/2013 with neuro, responsive during episodes, did not look like partial seizure, doing well at her baseline only would  return to clinic for new issues.  History Patient Active Problem List   Diagnosis Date Noted   Other specified persistent mood disorders 02/14/2021   Drug induced subacute dyskinesia 08/27/2020   Mild intellectual disabilities 11/20/2019   Elevated alanine aminotransferase (ALT) level 09/27/2017   Class 1 obesity due to excess calories without serious comorbidity with body mass index (BMI) of 32.0 to 32.9 in adult 12/01/2015   Pure hypercholesterolemia 12/01/2015   Seasonal allergic rhinitis due to pollen 12/01/2014   Slow transit constipation 12/01/2014   Primary ovarian insufficiency 12/01/2014   Hearing loss 12/01/2014   Fetal valproate syndrome 08/26/2014   Hyperprolactinemia 08/24/2014   Tongue coating 12/31/2013   Seizures (HCC) 06/26/2012   ADD (attention deficit disorder)    Kyphosis 01/25/2011   Mental developmental delay 01/25/2011   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    Hearing loss    Primary ovarian insufficiency    Screening for mental disease/developmental disorder    Past Surgical History:  Procedure Laterality Date   BACK SURGERY     scoliosis; teenage   SPINE SURGERY N/A    [employer] 08/16/2019   Allergies[1] Prior to Admission medications  Medication Sig Start Date End  Date Taking? Authorizing Provider  Ascorbic Acid (VITAMIN C) 100 MG tablet Take 100 mg by mouth daily.   Yes [provider]  calcium -vitamin D  (OSCAL WITH D) 500-200 MG-UNIT tablet Take 1 tablet by mouth 2 (two) times daily. 06/28/16  Yes Claudene Rayfield HERO, MD  CRANBERRY EXTRACT PO Take 1 capsule by mouth daily.   Yes [provider]  escitalopram (LEXAPRO) 20 MG tablet TK 1  T PO QD 02/10/17  Yes [provider]  fluticasone  (FLONASE ) 50 MCG/ACT nasal spray SHAKE LIQUID AND USE 1 SPRAY IN EACH NOSTRIL DAILY 02/26/23  Yes Levora Reyes SAUNDERS, MD  guanFACINE (INTUNIV) 2 MG TB24 ER tablet Take 1 mg by mouth daily. Mother notes this is at 1 mg ER 05/11/21  Yes [provider]  lisdexamfetamine (VYVANSE) 40 MG capsule Take 40 mg by mouth every morning. Reported on 01/17/2015   Yes [provider]  loratadine  (CLARITIN ) 10 MG tablet Take 1 tablet (10 mg total) by mouth daily. 04/01/17  Yes Smith, Kristi M, MD  LORazepam (ATIVAN) 1 MG tablet Take 1 mg by mouth daily as needed for anxiety.   Yes [provider]  Multiple Vitamins-Minerals (MULTIVITAMIN PO) Take by mouth daily.   Yes [provider]  norethindrone-ethinyl estradiol -iron (MICROGESTIN FE,GILDESS FE,LOESTRIN FE) 1.5-30 MG-MCG tablet Take 1 tablet by mouth daily.   Yes [provider]  polyethylene glycol powder (GLYCOLAX /MIRALAX ) 17 GM/SCOOP powder Take by mouth.   Yes [provider]  Probiotic Product (PROBIOTIC DAILY PO) Take by mouth.   Yes [provider]  risperiDONE (RISPERDAL) 1 MG tablet Take 1 mg by mouth daily. 11/18/16  Yes [provider]   Social History   Socioeconomic History   Marital status: Single    Spouse name: Not on file   Number of children: 0   Years of education: 12   Highest education level: High school graduate  Occupational History   Occupation: disability    Comment: works at [employer]  Tobacco Use   Smoking status: Never   Smokeless tobacco: Never  Vaping Use   Vaping status: Never Used  Substance and Sexual Activity   Alcohol use: No   Drug use: No   Sexual activity: Never    Birth control/protection: None  Other Topics Concern   Not on file  Social History Narrative   Marital status: single; not dating in 2019.  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      Lives: Patient lives with  Sonny Slocumb     Education: Patient has 12th grade education.       Employment:  Disability.      Education: attends [employer]; M-F 10-4      Tobacco: none      Alcohol: none      Exercise: walking on weekends; some exercise at day program. Bowls once a month     Right handed.   Caffeine- Couple times a week.- One   Social Drivers of Health   Tobacco Use: Low Risk (01/16/2024)   Patient History    Smoking Tobacco Use: Never    Smokeless Tobacco Use: Never    Passive Exposure: Not on file  Financial Resource Strain: Low Risk (10/09/2023)   Overall Financial Resource Strain (CARDIA)    Difficulty of Paying Living Expenses: Not hard at all  Food Insecurity: No Food Insecurity (10/09/2023)   Epic    Worried About Radiation Protection Practitioner of Food in the Last Year: Never true  Ran Out of Food in the Last Year: Never true  Transportation Needs: Unknown (10/09/2023)   Epic    Lack of Transportation (Medical): No    Lack of Transportation (Non-Medical): Not on file  Physical Activity: Insufficiently Active (10/09/2023)   Exercise Vital Sign    Days of Exercise per Week: 4 days    Minutes of Exercise per Session: 30 min  Stress: No Stress Concern Present (10/09/2023)   [name] of Occupational Health - Occupational Stress Questionnaire    Feeling of Stress: Not at all  Social Connections: Moderately Isolated (10/09/2023)   Social Connection and Isolation Panel    Frequency of Communication with Friends and Family: More than three times a week    Frequency of Social Gatherings with Friends and Family: More than three times a week    Attends Religious Services: More than 4 times per year    Active Member of Clubs or Organizations: No    Attends Banker Meetings: Never    Marital Status: Never married  Intimate Partner Violence: Not At Risk (10/09/2023)   Epic    Fear of Current or Ex-Partner: No    Emotionally Abused: No    Physically Abused: No    Sexually Abused: No   Depression (PHQ2-9): Medium Risk (01/16/2024)   Depression (PHQ2-9)    PHQ-2 Score: 6  Alcohol Screen: Low Risk (10/09/2023)   Alcohol Screen    Last Alcohol Screening Score (AUDIT): 0  Housing: Unknown (10/09/2023)   Epic    Unable to Pay for Housing in the Last Year: No    Number of Times Moved in the Last Year: Not on file    Homeless in the Last Year: No  Utilities: Not At Risk (10/09/2023)   Epic    Threatened with loss of utilities: No  Health Literacy: Inadequate Health Literacy (10/09/2023)   B1300 Health Literacy    Frequency of need for help with medical instructions: Often    Review of Systems Per HPI  Objective:   Vitals:   01/16/24 0948  BP: 118/74  Pulse: 94  Resp: 16  Temp: 98.8 F (37.1 C)  TempSrc: Temporal  SpO2: 98%  Weight: 176 lb (79.8 kg)  Height: 5' 3 (1.6 m)     Physical Exam Vitals reviewed.  Constitutional:      Appearance: Normal appearance. She is well-developed.  HENT:     Head: Normocephalic and atraumatic.  Eyes:     Conjunctiva/sclera: Conjunctivae normal.     Pupils: Pupils are equal, round, and reactive to light.  Neck:     Vascular: No carotid bruit.  Cardiovascular:     Rate and Rhythm: Normal rate and regular rhythm.     Heart sounds: Normal heart sounds.  Pulmonary:     Effort: Pulmonary effort is normal.     Breath sounds: Normal breath sounds.  Abdominal:     Palpations: Abdomen is soft. There is no pulsatile mass.     Tenderness: There is no abdominal tenderness.  Musculoskeletal:     Right lower leg: No edema.     Left lower leg: No edema.  Skin:    General: Skin is warm and dry.     Comments: Anxiety, excoriated areas on fingers but no active erythema, swelling, wounds appreciated.  Small abrasion on surface of left thumb, again superficial without surrounding erythema or discharge.  No sign of paronychia.  Neurological:     Mental Status: She is alert and oriented to  person, place, and time.  Psychiatric:         Mood and Affect: Mood normal.        Behavior: Behavior normal.        Assessment & Plan:  ABEL RA is a 34 y.o. female . Nasal congestion - Plan: azelastine  (ASTELIN ) 0.1 % nasal sprayAllergic rhinitis, unspecified seasonality, unspecified trigger - Plan: fluticasone  (FLONASE ) 50 MCG/ACT nasal spray  - With persistent congestion, recommended trial of increased frequency of Flonase , then azelastine  spray if ineffective.  RTC precautions.  Finger abrasion, non-infected  - No sign of infection at this time.  May be related to her medications or anxiety, recommended other distractors or devices for fidgeting, or stress is reasonable.  Can also discuss with her psychiatrist to determine if any change in meds needed.  Eucerin for any dry skin, cuticle cream discussed to lessen picking or irritation of skin and nail folds with RTC precautions given if signs or symptoms of infection, paronychia.  Chronic constipation  - Stable with MiraLAX .  Continue same.  Seizures (HCC)  - As above, researched chart and it appears that there was an initial concern for partial seizure, and on further discussion with neurology, and after EEG, less likely to truly have seizure disorder.  Meds ordered this encounter  Medications   azelastine  (ASTELIN ) 0.1 % nasal spray    Sig: Place 1-2 sprays into both nostrils 2 (two) times daily as needed for rhinitis. Use in each nostril as directed    Dispense:  30 mL    Refill:  3   fluticasone  (FLONASE ) 50 MCG/ACT nasal spray    Sig: Place 1-2 sprays into both nostrils daily.    Dispense:  16 g    Refill:  11   Patient Instructions  For nasal congestion, initially try increasing the flonase  to 2 sprays per day.  Saline nasal spray as needed  If congestion does not improve can add new spray astelin  nasal spray. Let me know if that does not work.   Based on our records you may be due for an updated tetanus vaccine if that has not been given in the past 10  years.  Try cuticle cream around the fingernails to lessen picking or dry areas, risk of infection.  Eucerin cream can be used for other areas of dry skin or scratching on the hands.  Try fidget spinner, squeeze ball, or other distractor to help lessen picking at hands.  Can certainly discuss with your psychiatry provider as well but I do not recommend any other med changes at this time.      Signed,   Reyes Pines, MD [facility], [facility] 01/16/2024 8:15 PM        [1] No Known Allergies  "

## 2024-01-16 NOTE — Patient Instructions (Addendum)
 For nasal congestion, initially try increasing the flonase  to 2 sprays per day.  Saline nasal spray as needed  If congestion does not improve can add new spray astelin  nasal spray. Let me know if that does not work.   Based on our records you may be due for an updated tetanus vaccine if that has not been given in the past 10 years.  Try cuticle cream around the fingernails to lessen picking or dry areas, risk of infection.  Eucerin cream can be used for other areas of dry skin or scratching on the hands.  Try fidget spinner, squeeze ball, or other distractor to help lessen picking at hands.  Can certainly discuss with your psychiatry provider as well but I do not recommend any other med changes at this time.

## 2024-08-18 ENCOUNTER — Ambulatory Visit: Admitting: Orthopedic Surgery

## 2024-10-21 ENCOUNTER — Ambulatory Visit

## 2025-01-21 ENCOUNTER — Encounter: Admitting: Family Medicine
